# Patient Record
Sex: Female | Born: 1960 | Race: White | Hispanic: No | Marital: Married | Smoking: Never smoker
Health system: Southern US, Community
[De-identification: ages and names within clinical notes are randomized; demographics above are authoritative.]

## PROBLEM LIST (undated history)

## (undated) DIAGNOSIS — G4700 Insomnia, unspecified: Secondary | ICD-10-CM

## (undated) DIAGNOSIS — F32A Depression, unspecified: Secondary | ICD-10-CM

## (undated) DIAGNOSIS — IMO0002 Reserved for concepts with insufficient information to code with codable children: Secondary | ICD-10-CM

## (undated) DIAGNOSIS — R531 Weakness: Secondary | ICD-10-CM

## (undated) DIAGNOSIS — R0602 Shortness of breath: Secondary | ICD-10-CM

## (undated) DIAGNOSIS — R350 Frequency of micturition: Secondary | ICD-10-CM

## (undated) DIAGNOSIS — F418 Other specified anxiety disorders: Secondary | ICD-10-CM

## (undated) DIAGNOSIS — K219 Gastro-esophageal reflux disease without esophagitis: Secondary | ICD-10-CM

## (undated) DIAGNOSIS — F419 Anxiety disorder, unspecified: Secondary | ICD-10-CM

## (undated) DIAGNOSIS — Z8709 Personal history of other diseases of the respiratory system: Secondary | ICD-10-CM

## (undated) DIAGNOSIS — R42 Dizziness and giddiness: Secondary | ICD-10-CM

## (undated) DIAGNOSIS — K5900 Constipation, unspecified: Secondary | ICD-10-CM

## (undated) DIAGNOSIS — M549 Dorsalgia, unspecified: Secondary | ICD-10-CM

## (undated) DIAGNOSIS — F329 Major depressive disorder, single episode, unspecified: Secondary | ICD-10-CM

## (undated) DIAGNOSIS — M62838 Other muscle spasm: Secondary | ICD-10-CM

## (undated) DIAGNOSIS — R51 Headache: Secondary | ICD-10-CM

## (undated) DIAGNOSIS — R351 Nocturia: Secondary | ICD-10-CM

## (undated) DIAGNOSIS — E559 Vitamin D deficiency, unspecified: Secondary | ICD-10-CM

## (undated) DIAGNOSIS — M1990 Unspecified osteoarthritis, unspecified site: Secondary | ICD-10-CM

## (undated) HISTORY — DX: Reserved for concepts with insufficient information to code with codable children: IMO0002

## (undated) HISTORY — PX: SHOULDER ARTHROSCOPY WITH ROTATOR CUFF REPAIR: SHX5685

## (undated) HISTORY — DX: Unspecified osteoarthritis, unspecified site: M19.90

## (undated) HISTORY — PX: COLONOSCOPY: SHX174

## (undated) HISTORY — PX: ABDOMINAL HYSTERECTOMY: SHX81

## (undated) HISTORY — PX: OVARIAN CYST SURGERY: SHX726

## (undated) HISTORY — PX: EXPLORATORY LAPAROTOMY: SUR591

## (undated) HISTORY — PX: BACK SURGERY: SHX140

---

## 1978-11-11 HISTORY — PX: TUBAL LIGATION: SHX77

## 1997-10-06 ENCOUNTER — Emergency Department (HOSPITAL_COMMUNITY): Admission: EM | Admit: 1997-10-06 | Discharge: 1997-10-06 | Payer: Self-pay | Admitting: Emergency Medicine

## 1997-10-06 ENCOUNTER — Inpatient Hospital Stay (HOSPITAL_COMMUNITY): Admission: EM | Admit: 1997-10-06 | Discharge: 1997-10-09 | Payer: Self-pay | Admitting: Emergency Medicine

## 1999-05-25 ENCOUNTER — Encounter: Payer: Self-pay | Admitting: Obstetrics and Gynecology

## 1999-05-25 ENCOUNTER — Encounter: Admission: RE | Admit: 1999-05-25 | Discharge: 1999-05-25 | Payer: Self-pay | Admitting: Obstetrics and Gynecology

## 1999-11-25 ENCOUNTER — Emergency Department (HOSPITAL_COMMUNITY): Admission: EM | Admit: 1999-11-25 | Discharge: 1999-11-26 | Payer: Self-pay | Admitting: *Deleted

## 1999-11-26 ENCOUNTER — Encounter: Payer: Self-pay | Admitting: Emergency Medicine

## 2000-03-07 ENCOUNTER — Other Ambulatory Visit: Admission: RE | Admit: 2000-03-07 | Discharge: 2000-03-07 | Payer: Self-pay | Admitting: Orthopedic Surgery

## 2000-06-27 ENCOUNTER — Encounter: Admission: RE | Admit: 2000-06-27 | Discharge: 2000-06-27 | Payer: Self-pay | Admitting: Obstetrics and Gynecology

## 2000-06-27 ENCOUNTER — Encounter: Payer: Self-pay | Admitting: Obstetrics and Gynecology

## 2007-08-08 ENCOUNTER — Emergency Department (HOSPITAL_COMMUNITY): Admission: EM | Admit: 2007-08-08 | Discharge: 2007-08-08 | Payer: Self-pay | Admitting: Emergency Medicine

## 2007-08-17 ENCOUNTER — Emergency Department (HOSPITAL_COMMUNITY): Admission: EM | Admit: 2007-08-17 | Discharge: 2007-08-17 | Payer: Self-pay | Admitting: Family Medicine

## 2008-04-29 ENCOUNTER — Other Ambulatory Visit: Admission: RE | Admit: 2008-04-29 | Discharge: 2008-04-29 | Payer: Self-pay | Admitting: Obstetrics and Gynecology

## 2009-05-25 ENCOUNTER — Other Ambulatory Visit: Admission: RE | Admit: 2009-05-25 | Discharge: 2009-05-25 | Payer: Self-pay | Admitting: Obstetrics and Gynecology

## 2010-06-27 ENCOUNTER — Other Ambulatory Visit: Payer: Self-pay | Admitting: Family Medicine

## 2010-06-27 DIAGNOSIS — N61 Mastitis without abscess: Secondary | ICD-10-CM

## 2010-06-27 DIAGNOSIS — N6310 Unspecified lump in the right breast, unspecified quadrant: Secondary | ICD-10-CM

## 2010-06-27 DIAGNOSIS — N63 Unspecified lump in unspecified breast: Secondary | ICD-10-CM

## 2010-06-28 ENCOUNTER — Ambulatory Visit
Admission: RE | Admit: 2010-06-28 | Discharge: 2010-06-28 | Disposition: A | Discharge status: Home / Self Care | Payer: 59 | Source: Ambulatory Visit | Attending: Family Medicine | Admitting: Family Medicine

## 2010-06-28 ENCOUNTER — Ambulatory Visit
Admission: RE | Admit: 2010-06-28 | Discharge: 2010-06-28 | Disposition: A | Discharge status: Home / Self Care | Payer: Self-pay | Source: Ambulatory Visit | Attending: Family Medicine | Admitting: Family Medicine

## 2010-06-28 ENCOUNTER — Other Ambulatory Visit: Payer: Self-pay | Admitting: Family Medicine

## 2010-06-28 DIAGNOSIS — N63 Unspecified lump in unspecified breast: Secondary | ICD-10-CM

## 2010-06-28 DIAGNOSIS — N61 Mastitis without abscess: Secondary | ICD-10-CM

## 2010-08-16 ENCOUNTER — Encounter (INDEPENDENT_AMBULATORY_CARE_PROVIDER_SITE_OTHER): Payer: Self-pay | Admitting: Surgery

## 2010-12-06 LAB — DIFFERENTIAL
Basophils Relative: 0
Eosinophils Absolute: 0
Lymphs Abs: 1.1
Neutrophils Relative %: 67

## 2010-12-06 LAB — CBC
MCHC: 34.4
MCV: 90
Platelets: 217
WBC: 4.2

## 2010-12-06 LAB — POCT I-STAT, CHEM 8
HCT: 37
Hemoglobin: 12.6
Potassium: 4.4
Sodium: 139

## 2011-04-25 ENCOUNTER — Emergency Department (INDEPENDENT_AMBULATORY_CARE_PROVIDER_SITE_OTHER)
Admission: EM | Admit: 2011-04-25 | Discharge: 2011-04-25 | Disposition: A | Discharge status: Home / Self Care | Payer: Self-pay | Source: Home / Self Care | Attending: Family Medicine | Admitting: Family Medicine

## 2011-04-25 ENCOUNTER — Encounter (HOSPITAL_COMMUNITY): Payer: Self-pay | Admitting: *Deleted

## 2011-04-25 DIAGNOSIS — R059 Cough, unspecified: Secondary | ICD-10-CM

## 2011-04-25 DIAGNOSIS — R05 Cough: Secondary | ICD-10-CM

## 2011-04-25 DIAGNOSIS — R058 Other specified cough: Secondary | ICD-10-CM

## 2011-04-25 MED ORDER — HYDROCOD POLST-CHLORPHEN POLST 10-8 MG/5ML PO LQCR: 5.0000 mL | Freq: Two times a day (BID) | ORAL | Status: DC | PRN

## 2011-04-25 NOTE — ED Notes (Signed)
Pt with c/o cough/congestion/sorethroat onset x 10 days - dry cough

## 2011-04-25 NOTE — ED Provider Notes (Signed)
History     CSN: [id number]  Arrival date & time 04/25/11  1223   First MD Initiated Contact with Patient 04/25/11 1301      Chief Complaint  Patient presents with  . Cough  . Nasal Congestion  . Sore Throat    (Consider location/radiation/quality/duration/timing/severity/associated sxs/prior treatment) HPI Comments: [name] presents for evaluation for persistent nonproductive cough. She denies any other symptoms. No nasal congestion. No rhinorrhea. No fever. She reports chills last week. States that her father had the flu in December. She's been taking many over-the-counter products without relief.  Patient is a 51 y.o. female presenting with cough. The history is provided by the patient.  Cough This is a new problem. The current episode started more than 1 week ago. The problem occurs constantly. The problem has not changed since onset.The cough is non-productive. There has been no fever. Associated symptoms include sore throat. She has tried decongestants and cough syrup for the symptoms. The treatment provided no relief. She is not a smoker.    Past Medical History  Diagnosis Date  . Ulcer   . Hernia   . Arthritis     Past Surgical History  Procedure Date  . Abdominal hysterectomy   . Ovarian cyst surgery     Family History  Problem Relation Age of Onset  . COPD Father     History  Substance Use Topics  . Smoking status: Never Smoker   . Smokeless tobacco: Not on file  . Alcohol Use: Yes     RARE    OB History    Grav Para Term Preterm Abortions TAB SAB Ect Mult Living                  Review of Systems  Constitutional: Negative.   HENT: Positive for sore throat.   Eyes: Negative.   Respiratory: Positive for cough.   Cardiovascular: Negative.   Gastrointestinal: Negative.   Genitourinary: Negative.   Musculoskeletal: Negative.   Skin: Negative.   Neurological: Negative.     Allergies  Toradol  Home Medications   Current Outpatient Rx  Name  Route Sig Dispense Refill  . CORICIDIN D PO Oral Take by mouth.    . ESTRADIOL 0.05 MG/24HR TD PTTW Transdermal Place 1 patch onto the skin once a week.      . GUAIFENESIN ER 600 MG PO TB12 Oral Take 1,200 mg by mouth 2 (two) times daily.    Marland Kitchen HYDROCOD POLST-CPM POLST ER 10-8 MG/5ML PO LQCR Oral Take 5 mLs by mouth every 12 (twelve) hours as needed. 140 mL 0  . DOXYCYCLINE HYCLATE 100 MG PO CAPS Oral Take 100 mg by mouth 2 (two) times daily.        BP 136/79  Pulse 74  Temp(Src) 98.4 F (36.9 C) (Oral)  Resp 18  SpO2 99%  Physical Exam  Nursing note and vitals reviewed. Constitutional: She is oriented to person, place, and time. She appears well-developed and well-nourished.  HENT:  Head: Normocephalic and atraumatic.  Right Ear: Tympanic membrane normal.  Left Ear: Tympanic membrane normal.  Mouth/Throat: Uvula is midline, oropharynx is clear and moist and mucous membranes are normal.  Eyes: EOM are normal.  Neck: Normal range of motion.  Cardiovascular: Normal rate and regular rhythm.   Pulmonary/Chest: Effort normal and breath sounds normal. She has no wheezes. She has no rales.  Musculoskeletal: Normal range of motion.  Neurological: She is alert and oriented to person, place, and time.  Skin:  Skin is warm and dry.  Psychiatric: Her behavior is normal.    ED Course  Procedures (including critical care time)  Labs Reviewed - No data to display No results found.   1. Post-viral cough syndrome       MDM  rx given for Tussionex Pennkinetic syrup        Richardo Priest, MD 04/25/11 1409

## 2011-04-25 NOTE — Discharge Instructions (Signed)
Your exam today, was unremarkable. Your lungs were clear. This is likely a postviral inflammatory cough. I prescribed you a cough syrup containing a narcotic. Please return to care. Should her symptoms not improve or worsen in any way.

## 2011-06-06 ENCOUNTER — Emergency Department (HOSPITAL_COMMUNITY): Payer: PRIVATE HEALTH INSURANCE

## 2011-06-06 ENCOUNTER — Encounter (HOSPITAL_COMMUNITY): Payer: Self-pay | Admitting: Emergency Medicine

## 2011-06-06 ENCOUNTER — Emergency Department (HOSPITAL_COMMUNITY)
Admission: EM | Admit: 2011-06-06 | Discharge: 2011-06-06 | Disposition: A | Discharge status: Home / Self Care | Payer: PRIVATE HEALTH INSURANCE | Attending: Emergency Medicine | Admitting: Emergency Medicine

## 2011-06-06 DIAGNOSIS — S335XXA Sprain of ligaments of lumbar spine, initial encounter: Secondary | ICD-10-CM | POA: Insufficient documentation

## 2011-06-06 DIAGNOSIS — M545 Low back pain, unspecified: Secondary | ICD-10-CM | POA: Insufficient documentation

## 2011-06-06 DIAGNOSIS — S39012A Strain of muscle, fascia and tendon of lower back, initial encounter: Secondary | ICD-10-CM

## 2011-06-06 DIAGNOSIS — H9319 Tinnitus, unspecified ear: Secondary | ICD-10-CM | POA: Insufficient documentation

## 2011-06-06 DIAGNOSIS — H538 Other visual disturbances: Secondary | ICD-10-CM | POA: Insufficient documentation

## 2011-06-06 DIAGNOSIS — S161XXA Strain of muscle, fascia and tendon at neck level, initial encounter: Secondary | ICD-10-CM

## 2011-06-06 DIAGNOSIS — W19XXXA Unspecified fall, initial encounter: Secondary | ICD-10-CM

## 2011-06-06 DIAGNOSIS — S139XXA Sprain of joints and ligaments of unspecified parts of neck, initial encounter: Secondary | ICD-10-CM | POA: Insufficient documentation

## 2011-06-06 DIAGNOSIS — M542 Cervicalgia: Secondary | ICD-10-CM | POA: Insufficient documentation

## 2011-06-06 DIAGNOSIS — S0990XA Unspecified injury of head, initial encounter: Secondary | ICD-10-CM | POA: Insufficient documentation

## 2011-06-06 DIAGNOSIS — M129 Arthropathy, unspecified: Secondary | ICD-10-CM | POA: Insufficient documentation

## 2011-06-06 DIAGNOSIS — R51 Headache: Secondary | ICD-10-CM | POA: Insufficient documentation

## 2011-06-06 DIAGNOSIS — W108XXA Fall (on) (from) other stairs and steps, initial encounter: Secondary | ICD-10-CM | POA: Insufficient documentation

## 2011-06-06 DIAGNOSIS — IMO0002 Reserved for concepts with insufficient information to code with codable children: Secondary | ICD-10-CM | POA: Insufficient documentation

## 2011-06-06 MED ORDER — CYCLOBENZAPRINE HCL 10 MG PO TABS
10.0000 mg | ORAL_TABLET | Freq: Three times a day (TID) | ORAL | Status: AC | PRN
Start: 2011-06-06 — End: 2011-06-16

## 2011-06-06 MED ORDER — CYCLOBENZAPRINE HCL 10 MG PO TABS
10.0000 mg | ORAL_TABLET | Freq: Once | ORAL | Status: AC
  Administered 2011-06-06: 10 mg via ORAL
  Filled 2011-06-06: qty 1

## 2011-06-06 MED ORDER — OXYCODONE-ACETAMINOPHEN 5-325 MG PO TABS: 1.0000 | ORAL_TABLET | ORAL | Status: AC | PRN

## 2011-06-06 NOTE — ED Provider Notes (Signed)
History     CSN: [id number]  Arrival date & time 06/06/11  1606   First MD Initiated Contact with Patient 06/06/11 1705      Chief Complaint  Patient presents with  . Head Injury  . Back Pain    (Consider location/radiation/quality/duration/timing/severity/associated sxs/prior treatment) HPI Comments: Pt is a 51 year old woman who was going through a door and tripped on a step and fell.  This happened about 1:30 P.M.  She hit her head on some chair and landed on the left side on a hard carpetted floor. She was not rendered unconscious.  She scraped her knees, and put bandaids on the scraped areas.  She sat at her desk and developed headache and her computer screen became blurry.  She had ringing in her ears.  She felt pain in her head, neck, and lower back.  She went to [facility] and was referred to [facility] ED for further evaluation.  Patient is a 51 y.o. female presenting with head injury and back pain. The history is provided by the patient and medical records. No language interpreter was used.  Head Injury  The incident occurred 3 to 5 hours ago. She came to the ER via walk-in. The injury mechanism was a fall. There was no loss of consciousness. There was no blood loss. The quality of the pain is described as dull. The pain is at a severity of 10/10. The pain is severe. The pain has been constant since the injury. Associated symptoms include blurred vision, tinnitus and disorientation. Pertinent negatives include no vomiting, no weakness and no memory loss. Found by EMS: Came to ED via POV with family member. She has tried nothing for the symptoms.  Back Pain  Associated symptoms include headaches. Pertinent negatives include no weakness.    Past Medical History  Diagnosis Date  . Ulcer   . Hernia   . Arthritis     Past Surgical History  Procedure Date  . Abdominal hysterectomy   . Ovarian cyst surgery     Family History  Problem Relation Age of Onset  . COPD Father       History  Substance Use Topics  . Smoking status: Never Smoker   . Smokeless tobacco: Not on file  . Alcohol Use: No    OB History    Grav Para Term Preterm Abortions TAB SAB Ect Mult Living                  Review of Systems  Constitutional: Negative.   HENT: Positive for neck pain and tinnitus.   Eyes: Positive for blurred vision.  Respiratory: Negative.   Cardiovascular: Negative.   Gastrointestinal: Negative for vomiting.  Genitourinary: Negative.   Musculoskeletal: Positive for back pain.  Neurological: Positive for headaches. Negative for weakness.  Psychiatric/Behavioral: Negative.  Negative for memory loss.    Allergies  Toradol  Home Medications   Current Outpatient Rx  Name Route Sig Dispense Refill  . ALPRAZOLAM 2 MG PO TABS Oral Take 2 mg by mouth at bedtime as needed. For anxiety    . CORICIDIN D PO Oral Take by mouth.    Marland Kitchen HYDROCOD POLST-CPM POLST ER 10-8 MG/5ML PO LQCR Oral Take 5 mLs by mouth every 12 (twelve) hours as needed. 140 mL 0  . DOXYCYCLINE HYCLATE 100 MG PO CAPS Oral Take 100 mg by mouth 2 (two) times daily.      Marland Kitchen ESTRADIOL 0.05 MG/24HR TD PTTW Transdermal Place 1 patch  onto the skin once a week.      . GUAIFENESIN ER 600 MG PO TB12 Oral Take 1,200 mg by mouth 2 (two) times daily.    Marland Kitchen ZOLPIDEM TARTRATE 10 MG PO TABS Oral Take 10 mg by mouth at bedtime as needed. For insomnia      BP 139/80  Pulse 85  Temp(Src) 97.6 F (36.4 C) (Oral)  Resp 18  SpO2 96%  Physical Exam  Nursing note and vitals reviewed. Constitutional: She is oriented to person, place, and time.       Appears uncomfortable, has a cervical collar in place.  HENT:  Head: Normocephalic and atraumatic.  Right Ear: External ear normal.  Left Ear: External ear normal.  Mouth/Throat: Oropharynx is clear and moist.  Eyes: Conjunctivae and EOM are normal.  Neck: Normal range of motion. Neck supple.  Cardiovascular: Normal rate, regular rhythm and normal heart sounds.    Pulmonary/Chest: Effort normal and breath sounds normal.  Abdominal: Soft. Bowel sounds are normal.  Musculoskeletal:       Pain localized to lumbar region.  No palpable deformity or tenderness to palpation.   Neurological: She is alert and oriented to person, place, and time.       No sensory or motor deficit.  Skin: Skin is warm and dry.  Psychiatric: She has a normal mood and affect. Her behavior is normal.    ED Course  Procedures (including critical care time)  5:40 PM Pt seen -- had physical exam performed.  PO Flexeril ordered.  CT of head and neck, conventional x-rays of LS spine ordered.   7:09 PM Results for orders placed during the hospital encounter of 08/08/07  CBC      Component Value Range   WBC 4.2     RBC 4.15     Hemoglobin 12.9     HCT 37.4     MCV 90.0     MCHC 34.4     RDW 12.2     Platelets 217    DIFFERENTIAL      Component Value Range   Neutrophils Relative 67     Neutro Abs 2.8     Lymphocytes Relative 26     Lymphs Abs 1.1     Monocytes Relative 6     Monocytes Absolute 0.2     Eosinophils Relative 1     Eosinophils Absolute 0.0     Basophils Relative 0     Basophils Absolute 0.0    POCT I-STAT, CHEM 8      Component Value Range   Sodium 139     Potassium 4.4     Chloride 105     BUN 14     Creatinine, Ser 0.7     Glucose, Bld 84     Calcium, Ion 1.14     TCO2 25     Hemoglobin 12.6     HCT 37.0     Dg Lumbar Spine Complete  06/06/2011  *RADIOLOGY REPORT*  Clinical Data: Severe low back pain post fall at work  LUMBAR SPINE - COMPLETE 4+ VIEW  Comparison: None  Findings: Five non-rib bearing lumbar vertebrae. Minimal disc space narrowing endplate spur formation L2-L3. Vertebral body heights maintained. No acute fracture, subluxation or bone destruction. No spondylolysis. SI joints symmetric.  IMPRESSION: Mild degenerative disc disease changes L2-L3. No acute bony abnormalities.  Original Report Authenticated By: Lollie Marrow, M.D.    Ct Head Wo Contrast  06/06/2011  *RADIOLOGY REPORT*  Clinical Data:  Larey Seat.  Hit head.  CT HEAD WITHOUT CONTRAST CT CERVICAL SPINE WITHOUT CONTRAST  Technique:  Multidetector CT imaging of the head and cervical spine was performed following the standard protocol without intravenous contrast.  Multiplanar CT image reconstructions of the cervical spine were also generated.  Comparison:   None  CT HEAD  Findings: The ventricles are normal.  No extra-axial fluid collections are seen.  The brainstem and cerebellum are unremarkable.  No acute intracranial findings such as infarction or hemorrhage.  No mass lesions.  The bony calvarium is intact.  The visualized paranasal sinuses and mastoid air cells are clear.  IMPRESSION: No acute intracranial findings or skull fracture.  CT CERVICAL SPINE  Findings: The sagittal reformatted images demonstrate normal alignment of the cervical vertebral bodies.  Mild straightening of the normal cervical lordosis may be due to positioning, muscle spasm or pain.  Moderate degenerative disc disease noted at C5-6 and C6-7 with mild uncinate spurring changes and mild bony foraminal narrowing.  No acute bony findings or abnormal prevertebral soft tissue swelling.  The facets are normally aligned.  No facet or laminar fractures are seen. No large disc protrusions.  The neural foramen are patent.  The skull base C1 and C1-C2 articulations are maintained.  The dens is normal.  There are scattered cervical lymph nodes.  The lung apices are clear.  IMPRESSION:  1.  Degenerative cervical spondylosis with disc disease and facet disease most notable at C5-6 and C6-7. 2.  No acute cervical spine fracture.  Original Report Authenticated By: P. Loralie Champagne, M.D.   Ct Cervical Spine Wo Contrast  06/06/2011  *RADIOLOGY REPORT*  Clinical Data:  Larey Seat.  Hit head.  CT HEAD WITHOUT CONTRAST CT CERVICAL SPINE WITHOUT CONTRAST  Technique:  Multidetector CT imaging of the head and cervical spine was  performed following the standard protocol without intravenous contrast.  Multiplanar CT image reconstructions of the cervical spine were also generated.  Comparison:   None  CT HEAD  Findings: The ventricles are normal.  No extra-axial fluid collections are seen.  The brainstem and cerebellum are unremarkable.  No acute intracranial findings such as infarction or hemorrhage.  No mass lesions.  The bony calvarium is intact.  The visualized paranasal sinuses and mastoid air cells are clear.  IMPRESSION: No acute intracranial findings or skull fracture.  CT CERVICAL SPINE  Findings: The sagittal reformatted images demonstrate normal alignment of the cervical vertebral bodies.  Mild straightening of the normal cervical lordosis may be due to positioning, muscle spasm or pain.  Moderate degenerative disc disease noted at C5-6 and C6-7 with mild uncinate spurring changes and mild bony foraminal narrowing.  No acute bony findings or abnormal prevertebral soft tissue swelling.  The facets are normally aligned.  No facet or laminar fractures are seen. No large disc protrusions.  The neural foramen are patent.  The skull base C1 and C1-C2 articulations are maintained.  The dens is normal.  There are scattered cervical lymph nodes.  The lung apices are clear.  IMPRESSION:  1.  Degenerative cervical spondylosis with disc disease and facet disease most notable at C5-6 and C6-7. 2.  No acute cervical spine fracture.  Original Report Authenticated By: P. Loralie Champagne, M.D.    Rx with Flexeril for muscle spasm, Percocet for pain, No work for 3 days.   1. Fall   2. Cervical strain   3. Lumbosacral strain           Hessie Diener  Marcia Brash, MD 06/06/11 [phone]

## 2011-06-06 NOTE — ED Notes (Signed)
Patient given discharge instructions, information, prescriptions, and diet order. Patient states that they adequately understand discharge information given and to return to ED if symptoms return or worsen.     

## 2011-06-06 NOTE — ED Notes (Signed)
Pt presenting to ed with c/o falling into a door pt states she missed a step. Pt states she hit her head. Pt states onset 13:30. Pt states she took aleve with no relief pt states she does not remember if she had loc. Pt states she continued to sit at her desk and developed neck, back pain and ringing in her ears. Pt states she presented to [facility] and was told to present to ed. Pt states she had some confusion. Pt is  Alert and oriented. Pt denies nausea and vomiting but states when she closes her eyes she feels like she's moving.

## 2012-07-27 ENCOUNTER — Emergency Department (HOSPITAL_COMMUNITY)
Admission: EM | Admit: 2012-07-27 | Discharge: 2012-07-27 | Disposition: A | Discharge status: Home / Self Care | Payer: BC Managed Care – PPO | Attending: Emergency Medicine | Admitting: Emergency Medicine

## 2012-07-27 ENCOUNTER — Emergency Department (HOSPITAL_COMMUNITY): Payer: BC Managed Care – PPO

## 2012-07-27 ENCOUNTER — Encounter (HOSPITAL_COMMUNITY): Payer: Self-pay | Admitting: Emergency Medicine

## 2012-07-27 DIAGNOSIS — Z8719 Personal history of other diseases of the digestive system: Secondary | ICD-10-CM | POA: Insufficient documentation

## 2012-07-27 DIAGNOSIS — W19XXXA Unspecified fall, initial encounter: Secondary | ICD-10-CM | POA: Insufficient documentation

## 2012-07-27 DIAGNOSIS — S0093XA Contusion of unspecified part of head, initial encounter: Secondary | ICD-10-CM

## 2012-07-27 DIAGNOSIS — W102XXA Fall (on)(from) incline, initial encounter: Secondary | ICD-10-CM

## 2012-07-27 DIAGNOSIS — M1990 Unspecified osteoarthritis, unspecified site: Secondary | ICD-10-CM | POA: Insufficient documentation

## 2012-07-27 DIAGNOSIS — Y929 Unspecified place or not applicable: Secondary | ICD-10-CM | POA: Insufficient documentation

## 2012-07-27 DIAGNOSIS — Y9389 Activity, other specified: Secondary | ICD-10-CM | POA: Insufficient documentation

## 2012-07-27 DIAGNOSIS — Z79899 Other long term (current) drug therapy: Secondary | ICD-10-CM | POA: Insufficient documentation

## 2012-07-27 DIAGNOSIS — S0003XA Contusion of scalp, initial encounter: Secondary | ICD-10-CM | POA: Insufficient documentation

## 2012-07-27 MED ORDER — CYCLOBENZAPRINE HCL 10 MG PO TABS: 10.0000 mg | ORAL_TABLET | Freq: Two times a day (BID) | ORAL | Status: DC | PRN

## 2012-07-27 MED ORDER — ONDANSETRON 4 MG PO TBDP
4.0000 mg | ORAL_TABLET | Freq: Once | ORAL | Status: AC
  Administered 2012-07-27: 4 mg via ORAL
  Filled 2012-07-27: qty 1

## 2012-07-27 MED ORDER — HYDROCODONE-ACETAMINOPHEN 5-325 MG PO TABS: 1.0000 | ORAL_TABLET | ORAL | Status: DC | PRN

## 2012-07-27 MED ORDER — OXYCODONE-ACETAMINOPHEN 5-325 MG PO TABS
2.0000 | ORAL_TABLET | Freq: Once | ORAL | Status: AC
  Administered 2012-07-27: 2 via ORAL
  Filled 2012-07-27: qty 2

## 2012-07-27 NOTE — ED Notes (Signed)
Patient returned to room from CT. 

## 2012-07-27 NOTE — ED Provider Notes (Signed)
Medical screening examination/treatment/procedure(s) were performed by non-physician practitioner and as supervising physician I was immediately available for consultation/collaboration.   Glynn Octave, MD 07/27/12 [phone]

## 2012-07-27 NOTE — ED Notes (Signed)
Bed:WA25<BR> Expected date:<BR> Expected time:<BR> Means of arrival:<BR> Comments:<BR> triage

## 2012-07-27 NOTE — ED Notes (Signed)
Per patient, fell backwards while pushing grandson on swing-hit head, and doesn't remember, possible LOC for a few seconds-dazed, vision blurred in both eyes, feels very tired

## 2012-07-27 NOTE — ED Notes (Signed)
Patient transported to CT 

## 2012-07-27 NOTE — ED Provider Notes (Signed)
History     CSN: [id number]  Arrival date & time 07/27/12  1553   First MD Initiated Contact with Patient 07/27/12 1639      Chief Complaint  Patient presents with  . Fall    (Consider location/radiation/quality/duration/timing/severity/associated sxs/prior treatment) HPI  Patient presents ot the ED for evaluation after a fall that happened at 1200 today in [place]. She was sitting on a swing with her grandson in her lap when she attempted to go higher and fell off the swing backwards onto her headache neck. She thinks she had loc because she only remembers her 79 year old grandson standing over her. She was able to get up afterwards without nausea or vomiting. She had some blurry vision. Went to see her friend who is an Event organiser that felt she had a concussion. Patient rode in car all the way home but since she was still having neck and head pain decided to come get evaluated. Denies having continued blurred vision, weakness (generalized or focal). Her main complaint is pain and feeling sleepy.   Past Medical History  Diagnosis Date  . Ulcer   . Hernia   . Arthritis     Past Surgical History  Procedure Laterality Date  . Abdominal hysterectomy    . Ovarian cyst surgery      Family History  Problem Relation Age of Onset  . COPD Father     History  Substance Use Topics  . Smoking status: Never Smoker   . Smokeless tobacco: Not on file  . Alcohol Use: No    OB History   Grav Para Term Preterm Abortions TAB SAB Ect Mult Living                  Review of Systems  All other systems reviewed and are negative.    Allergies  Ketorolac tromethamine  Home Medications   Current Outpatient Rx  Name  Route  Sig  Dispense  Refill  . acetaminophen (TYLENOL) 500 MG tablet   Oral   Take 1,000 mg by mouth every 6 (six) hours as needed for pain.         Marland Kitchen alprazolam (XANAX) 2 MG tablet   Oral   Take 2 mg by mouth at bedtime as needed. For anxiety          . CALCIUM CARBONATE PO   Oral   Take 2 tablets by mouth daily. Calcium gummies         . escitalopram (LEXAPRO) 5 MG tablet   Oral   Take 5 mg by mouth daily.         Marland Kitchen estradiol (ESTRACE) 0.5 MG tablet   Oral   Take 0.5 mg by mouth daily.         . Vitamin D, Ergocalciferol, (DRISDOL) 50000 UNITS CAPS   Oral   Take 50,000 Units by mouth 2 (two) times a week. mon and thur         . zolpidem (AMBIEN) 10 MG tablet   Oral   Take 10 mg by mouth at bedtime as needed. For insomnia         . cyclobenzaprine (FLEXERIL) 10 MG tablet   Oral   Take 1 tablet (10 mg total) by mouth 2 (two) times daily as needed for muscle spasms.   20 tablet   0   . HYDROcodone-acetaminophen (NORCO/VICODIN) 5-325 MG per tablet   Oral   Take 1 tablet by mouth every 4 (four) hours as  needed for pain.   12 tablet   0     BP 140/66  Pulse 71  Temp(Src) 98.1 F (36.7 C) (Oral)  Resp 18  SpO2 99%  Physical Exam  Nursing note and vitals reviewed. Constitutional: She is oriented to person, place, and time. She appears well-developed and well-nourished. No distress.  HENT:  Head: Normocephalic. Head is with contusion. Head is without raccoon's eyes, without Battle's sign, without abrasion, without laceration, without right periorbital erythema and without left periorbital erythema.  Eyes: Pupils are equal, round, and reactive to light.  Neck: Normal range of motion. Neck supple. Spinous process tenderness and muscular tenderness present. Normal range of motion present.  Cardiovascular: Normal rate and regular rhythm.   Pulmonary/Chest: Effort normal.  Abdominal: Soft.  Neurological: She is alert and oriented to person, place, and time. She has normal strength. No cranial nerve deficit or sensory deficit.  Skin: Skin is warm and dry.    ED Course  Procedures (including critical care time)  Labs Reviewed - No data to display Ct Head Wo Contrast  07/27/2012   *RADIOLOGY REPORT*   Clinical Data:  The patient fell backwards hitting head. Transient amnesia.  Possible loss of consciousness.  CT HEAD WITHOUT CONTRAST CT CERVICAL SPINE WITHOUT CONTRAST  Technique:  Multidetector CT imaging of the head and cervical spine was performed following the standard protocol without intravenous contrast.  Multiplanar CT image reconstructions of the cervical spine were also generated.  Comparison:  06/06/2011  CT HEAD  Findings: There is normal vertebral body stature and alignment.  No parenchymal masses or mass effect.  There are no areas of abnormal parenchymal attenuation.  There is no evidence of an infarct.  No extra-axial masses or abnormal fluid collections.  There is no intracranial hemorrhage.  The visualized sinuses and mastoid air cells are clear.  No skull fracture.  IMPRESSION: Normal unenhanced CT scan of the brain.  CT CERVICAL SPINE  Findings: No fracture or spondylolisthesis.  There is a mild reversal of the normal cervical lordosis centered at C5-C6.  There is moderate loss of disc height with endplate osteophytes and sclerosis at C5-C6 and C6-C7.  This leads to minor neural foraminal narrowing on the right at both levels and on the left at C6-C7.  No disc herniations.  No significant central stenosis.  The soft tissues are unremarkable.  The lung apices are clear.  IMPRESSION: No fracture or acute finding.   Original Report Authenticated By: Amie Portland, M.D.   Ct Cervical Spine Wo Contrast  07/27/2012   *RADIOLOGY REPORT*  Clinical Data:  The patient fell backwards hitting head. Transient amnesia.  Possible loss of consciousness.  CT HEAD WITHOUT CONTRAST CT CERVICAL SPINE WITHOUT CONTRAST  Technique:  Multidetector CT imaging of the head and cervical spine was performed following the standard protocol without intravenous contrast.  Multiplanar CT image reconstructions of the cervical spine were also generated.  Comparison:  06/06/2011  CT HEAD  Findings: There is normal vertebral  body stature and alignment.  No parenchymal masses or mass effect.  There are no areas of abnormal parenchymal attenuation.  There is no evidence of an infarct.  No extra-axial masses or abnormal fluid collections.  There is no intracranial hemorrhage.  The visualized sinuses and mastoid air cells are clear.  No skull fracture.  IMPRESSION: Normal unenhanced CT scan of the brain.  CT CERVICAL SPINE  Findings: No fracture or spondylolisthesis.  There is a mild reversal of the normal cervical  lordosis centered at C5-C6.  There is moderate loss of disc height with endplate osteophytes and sclerosis at C5-C6 and C6-C7.  This leads to minor neural foraminal narrowing on the right at both levels and on the left at C6-C7.  No disc herniations.  No significant central stenosis.  The soft tissues are unremarkable.  The lung apices are clear.  IMPRESSION: No fracture or acute finding.   Original Report Authenticated By: Amie Portland, M.D.     1. Fall (on)(from) incline, initial encounter   2. Head contusion, initial encounter       MDM   .normal ct scans of head and neck. No deficits on  Neuro exam.   Long discussion about concussion precautions. No driving. 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 Follow-up with neuro if symptoms develop or return to ER. Her and her husband are comfortable with going home. Vitals stable, not difficulty ambulating.  Pt has been advised of the symptoms that warrant their return to the ED. Patient has voiced understanding and has agreed to follow-up with the PCP or specialist.       Dorthula Matas, PA-C 07/27/12 1812

## 2012-10-12 ENCOUNTER — Emergency Department (HOSPITAL_COMMUNITY)
Admission: EM | Admit: 2012-10-12 | Discharge: 2012-10-12 | Disposition: A | Discharge status: Home / Self Care | Payer: BC Managed Care – PPO | Attending: Emergency Medicine | Admitting: Emergency Medicine

## 2012-10-12 ENCOUNTER — Emergency Department (HOSPITAL_COMMUNITY): Payer: BC Managed Care – PPO

## 2012-10-12 ENCOUNTER — Encounter (HOSPITAL_COMMUNITY): Payer: Self-pay | Admitting: *Deleted

## 2012-10-12 DIAGNOSIS — R51 Headache: Secondary | ICD-10-CM | POA: Insufficient documentation

## 2012-10-12 DIAGNOSIS — Z8739 Personal history of other diseases of the musculoskeletal system and connective tissue: Secondary | ICD-10-CM | POA: Insufficient documentation

## 2012-10-12 DIAGNOSIS — Z79899 Other long term (current) drug therapy: Secondary | ICD-10-CM | POA: Insufficient documentation

## 2012-10-12 DIAGNOSIS — R42 Dizziness and giddiness: Secondary | ICD-10-CM | POA: Insufficient documentation

## 2012-10-12 DIAGNOSIS — L98499 Non-pressure chronic ulcer of skin of other sites with unspecified severity: Secondary | ICD-10-CM | POA: Insufficient documentation

## 2012-10-12 DIAGNOSIS — Z8719 Personal history of other diseases of the digestive system: Secondary | ICD-10-CM | POA: Insufficient documentation

## 2012-10-12 DIAGNOSIS — R3915 Urgency of urination: Secondary | ICD-10-CM | POA: Insufficient documentation

## 2012-10-12 LAB — BASIC METABOLIC PANEL
BUN: 10 mg/dL (ref 6–23)
Calcium: 9.3 mg/dL (ref 8.4–10.5)
Creatinine, Ser: 0.63 mg/dL (ref 0.50–1.10)
GFR calc non Af Amer: >90 mL/min (ref >90)
Glucose, Bld: 94 mg/dL (ref 70–99)
Potassium: 4 mEq/L (ref 3.5–5.1)

## 2012-10-12 LAB — CBC WITH DIFFERENTIAL/PLATELET
Basophils Relative: 0 % (ref 0–1)
Eosinophils Absolute: 0.1 10*3/uL (ref 0.0–0.7)
Eosinophils Relative: 1 % (ref 0–5)
HCT: 37 % (ref 36.0–46.0)
Hemoglobin: 12.6 g/dL (ref 12.0–15.0)
Lymphs Abs: 1.3 10*3/uL (ref 0.7–4.0)
MCH: 30.7 pg (ref 26.0–34.0)
MCHC: 34.1 g/dL (ref 30.0–36.0)
MCV: 90.2 fL (ref 78.0–100.0)
Monocytes Absolute: 0.4 10*3/uL (ref 0.1–1.0)
Monocytes Relative: 7 % (ref 3–12)
RBC: 4.1 MIL/uL (ref 3.87–5.11)

## 2012-10-12 LAB — URINALYSIS, ROUTINE W REFLEX MICROSCOPIC
Bilirubin Urine: NEGATIVE
Hgb urine dipstick: NEGATIVE
Ketones, ur: NEGATIVE mg/dL
Specific Gravity, Urine: 1.012 (ref 1.005–1.030)
Urobilinogen, UA: 0.2 mg/dL (ref 0.0–1.0)

## 2012-10-12 MED ORDER — ONDANSETRON HCL 4 MG PO TABS: 4.0000 mg | ORAL_TABLET | Freq: Three times a day (TID) | ORAL | Status: DC | PRN

## 2012-10-12 NOTE — ED Notes (Signed)
Pt states since Friday has been feeling dizzy, states went to PCP yesterday and was told it was the [place] she took Friday night, was told if the dizziness and lightheadedness didn't go away to come to the ER for a CT scan of the head, pt states on way to hospital she felt like she was going to pass out. Pt states has also been nauseous and taking zofran for that.

## 2012-10-12 NOTE — ED Provider Notes (Signed)
CSN: [id number]     Arrival date & time 10/12/12  1140 History     First MD Initiated Contact with Patient 10/12/12 1203     Chief Complaint  Patient presents with  . Dizziness   (Consider location/radiation/quality/duration/timing/severity/associated sxs/prior Treatment) HPI Comments: Patient is a 52 year old female who presents today with a sensation of being lightheaded since yesterday morning. She took an Ambien Friday night and woke up Saturday morning feeling nauseous and lightheaded. It is worse with movement. She took Zofran yesterday which helped with the nausea. She was evaluated at [facility] physicians who stated it was a reaction to the Ambien, but if she did not improve to the emergency department for a head CT. She has urinary urgency without dysuria. She denies chest pain, shortness of breath, diaphoresis, weakness, numbness, paresthesias.  The history is provided by the patient. No language interpreter was used.    Past Medical History  Diagnosis Date  . Ulcer   . Hernia   . Arthritis    Past Surgical History  Procedure Laterality Date  . Abdominal hysterectomy    . Ovarian cyst surgery     Family History  Problem Relation Age of Onset  . COPD Father    History  Substance Use Topics  . Smoking status: Never Smoker   . Smokeless tobacco: Never Used  . Alcohol Use: No   OB History   Grav Para Term Preterm Abortions TAB SAB Ect Mult Living                 Review of Systems  Constitutional: Negative for fever and chills.  Respiratory: Negative for shortness of breath.   Gastrointestinal: Negative for vomiting and abdominal pain.  Genitourinary: Positive for urgency.  Neurological: Positive for light-headedness and headaches. Negative for speech difficulty.  All other systems reviewed and are negative.    Allergies  Ketorolac tromethamine  Home Medications   Current Outpatient Rx  Name  Route  Sig  Dispense  Refill  . acetaminophen (TYLENOL) 500 MG  tablet   Oral   Take 1,000 mg by mouth every 6 (six) hours as needed for pain.         Marland Kitchen alprazolam (XANAX) 2 MG tablet   Oral   Take 2 mg by mouth at bedtime as needed. For anxiety         . CALCIUM CARBONATE PO   Oral   Take 2 tablets by mouth daily. Calcium gummies         . cyclobenzaprine (FLEXERIL) 10 MG tablet   Oral   Take 1 tablet (10 mg total) by mouth 2 (two) times daily as needed for muscle spasms.   20 tablet   0   . escitalopram (LEXAPRO) 5 MG tablet   Oral   Take 5 mg by mouth daily.         Marland Kitchen estradiol (ESTRACE) 0.5 MG tablet   Oral   Take 0.5 mg by mouth daily.         Marland Kitchen HYDROcodone-acetaminophen (NORCO/VICODIN) 5-325 MG per tablet   Oral   Take 1 tablet by mouth every 4 (four) hours as needed for pain.   12 tablet   0   . Vitamin D, Ergocalciferol, (DRISDOL) 50000 UNITS CAPS   Oral   Take 50,000 Units by mouth 2 (two) times a week. mon and thur         . zolpidem (AMBIEN) 10 MG tablet   Oral   Take 10 mg  by mouth at bedtime as needed. For insomnia          BP 117/61  Pulse 64  Temp(Src) 98 F (36.7 C) (Oral)  Resp 20  SpO2 96% Physical Exam  Nursing note and vitals reviewed. Constitutional: She is oriented to person, place, and time. She appears well-developed and well-nourished. No distress.  Patient laying comfortably in the bed; no distress  HENT:  Head: Normocephalic and atraumatic.  Right Ear: External ear normal.  Left Ear: External ear normal.  Nose: Nose normal.  Mouth/Throat: Oropharynx is clear and moist.  Eyes: Conjunctivae are normal.  Neck: Normal range of motion.  Cardiovascular: Normal rate, regular rhythm, normal heart sounds, intact distal pulses and normal pulses.   Pulmonary/Chest: Effort normal and breath sounds normal. No stridor. No respiratory distress. She has no wheezes. She has no rales.  Abdominal: Soft. She exhibits no distension. There is no tenderness. There is no rigidity, no rebound and no  guarding.  Musculoskeletal: Normal range of motion.  Neurological: She is alert and oriented to person, place, and time. She has normal strength. No sensory deficit. She exhibits normal muscle tone. Coordination and gait normal.  Skin: Skin is warm and dry. She is not diaphoretic. No erythema.  Psychiatric: She has a normal mood and affect. Her behavior is normal.    ED Course   Procedures (including critical care time)  Labs Reviewed  CBC WITH DIFFERENTIAL  BASIC METABOLIC PANEL  URINALYSIS, ROUTINE W REFLEX MICROSCOPIC    Date: 10/12/2012  Rate: 57  Rhythm: normal sinus rhythm  QRS Axis: normal  Intervals: normal  ST/T Wave abnormalities: normal  Conduction Disutrbances:none  Narrative Interpretation:   Old EKG Reviewed: unchanged    Ct Head Wo Contrast  10/12/2012   *RADIOLOGY REPORT*  Clinical Data: Severe headache.  Nausea.  3-day history of dizziness.  CT HEAD WITHOUT CONTRAST  Technique:  Contiguous axial images were obtained from the base of the skull through the vertex without contrast.  Comparison: CT head 07/27/2012, 06/06/2011.  Findings: Ventricular system normal in size and appearance for age. No mass lesion.  No midline shift.  No acute hemorrhage or hematoma.  No extra-axial fluid collections.  No evidence of acute infarction.  No focal brain parenchymal abnormalities.  No significant interval change.  No focal osseous abnormalities involving the skull.  Visualized paranasal sinuses, mastoid air cells, and middle ear cavities well- aerated.  IMPRESSION: Normal and stable examination.   Original Report Authenticated By: Hulan Saas, M.D.   1. Lightheadedness     MDM  Patient presents with lightheadedness. CT head normal and stable. CBC, BMP, UA all WNL. Patient is not orthostatic. EKG WNL. Follow up with PCP tomorrow. Return instructions given. Vital signs stable for discharge. Discussed case with Dr. Judd Lien who agrees with plan. Patient / Family / Caregiver  informed of clinical course, understand medical decision-making process, and agree with plan.    Mora Bellman, PA-C 10/12/12 1524

## 2012-10-13 NOTE — ED Provider Notes (Signed)
Medical screening examination/treatment/procedure(s) were performed by non-physician practitioner and as supervising physician I was immediately available for consultation/collaboration.  Levette Paulick, MD 10/13/12 1119 

## 2013-02-12 ENCOUNTER — Ambulatory Visit (INDEPENDENT_AMBULATORY_CARE_PROVIDER_SITE_OTHER): Payer: BC Managed Care – PPO | Admitting: Podiatry

## 2013-02-12 ENCOUNTER — Encounter: Payer: Self-pay | Admitting: Podiatry

## 2013-02-12 ENCOUNTER — Ambulatory Visit (INDEPENDENT_AMBULATORY_CARE_PROVIDER_SITE_OTHER): Payer: BC Managed Care – PPO

## 2013-02-12 ENCOUNTER — Telehealth: Payer: Self-pay | Admitting: *Deleted

## 2013-02-12 VITALS — BP 112/78 | HR 86 | Resp 16 | Ht 65.0 in | Wt 160.0 lb

## 2013-02-12 DIAGNOSIS — M722 Plantar fascial fibromatosis: Secondary | ICD-10-CM

## 2013-02-12 MED ORDER — MELOXICAM 15 MG PO TABS: 15.0000 mg | ORAL_TABLET | Freq: Every day | ORAL | Status: DC

## 2013-02-12 MED ORDER — METHYLPREDNISOLONE (PAK) 4 MG PO TABS: ORAL_TABLET | ORAL | Status: DC

## 2013-02-12 NOTE — Patient Instructions (Signed)
Plantar Fasciitis (Heel Spur Syndrome)  with Rehab  The plantar fascia is a fibrous, ligament-like, soft-tissue structure that spans the bottom of the foot. Plantar fasciitis is a condition that causes pain in the foot due to inflammation of the tissue.  SYMPTOMS   · Pain and tenderness on the underneath side of the foot.  · Pain that worsens with standing or walking.  CAUSES   Plantar fasciitis is caused by irritation and injury to the plantar fascia on the underneath side of the foot. Common mechanisms of injury include:  · Direct trauma to bottom of the foot.  · Damage to a small nerve that runs under the foot where the main fascia attaches to the heel bone.  · Stress placed on the plantar fascia due to bone spurs.  RISK INCREASES WITH:   · Activities that place stress on the plantar fascia (running, jumping, pivoting, or cutting).  · Poor strength and flexibility.  · Improperly fitted shoes.  · Tight calf muscles.  · Flat feet.  · Failure to warm-up properly before activity.  · Obesity.  PREVENTION  · Warm up and stretch properly before activity.  · Allow for adequate recovery between workouts.  · Maintain physical fitness:  · Strength, flexibility, and endurance.  · Cardiovascular fitness.  · Maintain a health body weight.  · Avoid stress on the plantar fascia.  · Wear properly fitted shoes, including arch supports for individuals who have flat feet.  PROGNOSIS   If treated properly, then the symptoms of plantar fasciitis usually resolve without surgery. However, occasionally surgery is necessary.  RELATED COMPLICATIONS   · Recurrent symptoms that may result in a chronic condition.  · Problems of the lower back that are caused by compensating for the injury, such as limping.  · Pain or weakness of the foot during push-off following surgery.  · Chronic inflammation, scarring, and partial or complete fascia tear, occurring more often from repeated injections.  TREATMENT   Treatment initially involves the use of  ice and medication to help reduce pain and inflammation. The use of strengthening and stretching exercises may help reduce pain with activity, especially stretches of the Achilles tendon. These exercises may be performed at home or with a therapist. Your caregiver may recommend that you use heel cups of arch supports to help reduce stress on the plantar fascia. Occasionally, corticosteroid injections are given to reduce inflammation. If symptoms persist for greater than 6 months despite non-surgical (conservative), then surgery may be recommended.   MEDICATION   · If pain medication is necessary, then nonsteroidal anti-inflammatory medications, such as aspirin and ibuprofen, or other minor pain relievers, such as acetaminophen, are often recommended.  · Do not take pain medication within 7 days before surgery.  · Prescription pain relievers may be given if deemed necessary by your caregiver. Use only as directed and only as much as you need.  · Corticosteroid injections may be given by your caregiver. These injections should be reserved for the most serious cases, because they may only be given a certain number of times.  HEAT AND COLD  · Cold treatment (icing) relieves pain and reduces inflammation. Cold treatment should be applied for 10 to 15 minutes every 2 to 3 hours for inflammation and pain and immediately after any activity that aggravates your symptoms. Use ice packs or massage the area with a piece of ice (ice massage).  · Heat treatment may be used prior to performing the stretching and strengthening activities prescribed   by your caregiver, physical therapist, or athletic trainer. Use a heat pack or soak the injury in warm water.  SEEK IMMEDIATE MEDICAL CARE IF:  · Treatment seems to offer no benefit, or the condition worsens.  · Any medications produce adverse side effects.  EXERCISES  RANGE OF MOTION (ROM) AND STRETCHING EXERCISES - Plantar Fasciitis (Heel Spur Syndrome)  These exercises may help you  when beginning to rehabilitate your injury. Your symptoms may resolve with or without further involvement from your physician, physical therapist or athletic trainer. While completing these exercises, remember:   · Restoring tissue flexibility helps normal motion to return to the joints. This allows healthier, less painful movement and activity.  · An effective stretch should be held for at least 30 seconds.  · A stretch should never be painful. You should only feel a gentle lengthening or release in the stretched tissue.  RANGE OF MOTION - Toe Extension, Flexion  · Sit with your right / left leg crossed over your opposite knee.  · Grasp your toes and gently pull them back toward the top of your foot. You should feel a stretch on the bottom of your toes and/or foot.  · Hold this stretch for __________ seconds.  · Now, gently pull your toes toward the bottom of your foot. You should feel a stretch on the top of your toes and or foot.  · Hold this stretch for __________ seconds.  Repeat __________ times. Complete this stretch __________ times per day.   RANGE OF MOTION - Ankle Dorsiflexion, Active Assisted  · Remove shoes and sit on a chair that is preferably not on a carpeted surface.  · Place right / left foot under knee. Extend your opposite leg for support.  · Keeping your heel down, slide your right / left foot back toward the chair until you feel a stretch at your ankle or calf. If you do not feel a stretch, slide your bottom forward to the edge of the chair, while still keeping your heel down.  · Hold this stretch for __________ seconds.  Repeat __________ times. Complete this stretch __________ times per day.   STRETCH  Gastroc, Standing  · Place hands on wall.  · Extend right / left leg, keeping the front knee somewhat bent.  · Slightly point your toes inward on your back foot.  · Keeping your right / left heel on the floor and your knee straight, shift your weight toward the wall, not allowing your back to  arch.  · You should feel a gentle stretch in the right / left calf. Hold this position for __________ seconds.  Repeat __________ times. Complete this stretch __________ times per day.  STRETCH  Soleus, Standing  · Place hands on wall.  · Extend right / left leg, keeping the other knee somewhat bent.  · Slightly point your toes inward on your back foot.  · Keep your right / left heel on the floor, bend your back knee, and slightly shift your weight over the back leg so that you feel a gentle stretch deep in your back calf.  · Hold this position for __________ seconds.  Repeat __________ times. Complete this stretch __________ times per day.  STRETCH  Gastrocsoleus, Standing   Note: This exercise can place a lot of stress on your foot and ankle. Please complete this exercise only if specifically instructed by your caregiver.   · Place the ball of your right / left foot on a step, keeping   your other foot firmly on the same step.  · Hold on to the wall or a rail for balance.  · Slowly lift your other foot, allowing your body weight to press your heel down over the edge of the step.  · You should feel a stretch in your right / left calf.  · Hold this position for __________ seconds.  · Repeat this exercise with a slight bend in your right / left knee.  Repeat __________ times. Complete this stretch __________ times per day.   STRENGTHENING EXERCISES - Plantar Fasciitis (Heel Spur Syndrome)   These exercises may help you when beginning to rehabilitate your injury. They may resolve your symptoms with or without further involvement from your physician, physical therapist or athletic trainer. While completing these exercises, remember:   · Muscles can gain both the endurance and the strength needed for everyday activities through controlled exercises.  · Complete these exercises as instructed by your physician, physical therapist or athletic trainer. Progress the resistance and repetitions only as guided.  STRENGTH - Towel  Curls  · Sit in a chair positioned on a non-carpeted surface.  · Place your foot on a towel, keeping your heel on the floor.  · Pull the towel toward your heel by only curling your toes. Keep your heel on the floor.  · If instructed by your physician, physical therapist or athletic trainer, add ____________________ at the end of the towel.  Repeat __________ times. Complete this exercise __________ times per day.  STRENGTH - Ankle Inversion  · Secure one end of a rubber exercise band/tubing to a fixed object (table, pole). Loop the other end around your foot just before your toes.  · Place your fists between your knees. This will focus your strengthening at your ankle.  · Slowly, pull your big toe up and in, making sure the band/tubing is positioned to resist the entire motion.  · Hold this position for __________ seconds.  · Have your muscles resist the band/tubing as it slowly pulls your foot back to the starting position.  Repeat __________ times. Complete this exercises __________ times per day.   Document Released: 02/26/2005 Document Revised: 05/21/2011 Document Reviewed: 06/10/2008  [employer]® Patient Information ©2014 [employer].

## 2013-02-12 NOTE — Telephone Encounter (Signed)
Pt states would like Percocet , Vicodin does not help.  Dr Al Corpus states he does not give narcotics for plantar fasciitis.  I Informed of the orders and instructed her in the use of ice, Tylenol if she tolerated it and take all the steroid pack then Mobic , but not together. Pt states took one Mobic today.  I told her to begin the steroid pack tomorrow.  Pt states understanding.

## 2013-02-12 NOTE — Progress Notes (Signed)
   Subjective:    Patient ID: [name], female    DOB: [birth year], 52 y.o.   MRN: [id number]  HPI Comments: "My left heel hurts"  Patient c/o plantar heel left for approx 1 year. She has pain AM and throughout day. Tried Aleve, Naprosyn, insoles. The pain has been so bad that she has been nauseated. Aggravated by walking and standing.     Review of Systems  Constitutional: Positive for fatigue.  Eyes: Positive for itching.  Gastrointestinal: Positive for constipation and abdominal distention.  Musculoskeletal: Positive for arthralgias, back pain, gait problem and myalgias.  Neurological: Positive for headaches.  Hematological: Bruises/bleeds easily.       Objective:   Physical Exam: I have reviewed her past medical history medications and allergies. Vital signs are stable she is alert and oriented x3. Pulses are strongly palpable bilateral lower extremity. Capillary fill time to digits one through 5 of the bilateral foot is noted to be immediate. There is no calf pain. Deep tendon reflexes are intact bilateral. Muscle strength +5 over 5 dorsiflexors plantar flexors inverters and evertors are intact. All intrinsic musculature intact. Neurologic sensorium is intact per Semmes-Weinstein monofilament. Orthopedic evaluation demonstrates all joints distal to the ankle have a full range of motion without crepitation. She has no pain on medial lateral compression of the calcaneus. She does have pain on direct palpation of the medial calcaneal tubercle left foot. Radiographic evaluation does demonstrate soft tissue increase in density at the plantar fascial calcaneal insertion site.        Assessment & Plan:  Assessment: Plantar fasciitis left.  Plan: We discussed the etiology pathology conservative versus surgical therapies. I injected the left heel with Kenalog and local anesthetic. Put her in a plantar fascial strapping. Provided prescription for Medrol Dosepak to be followed by Mobic.  We discussed the appropriate shoe gear stretching exercises and ice therapy. I provided with her with stretching exercises and the definition of plantar fasciitis. We dispensed a night splint and I will followup with her in one month.

## 2013-03-17 ENCOUNTER — Ambulatory Visit: Payer: BC Managed Care – PPO | Admitting: Podiatry

## 2013-03-19 ENCOUNTER — Ambulatory Visit (INDEPENDENT_AMBULATORY_CARE_PROVIDER_SITE_OTHER): Payer: BC Managed Care – PPO | Admitting: Podiatry

## 2013-03-19 ENCOUNTER — Encounter: Payer: Self-pay | Admitting: Podiatry

## 2013-03-19 VITALS — BP 125/85 | HR 96 | Resp 12

## 2013-03-19 DIAGNOSIS — M722 Plantar fascial fibromatosis: Secondary | ICD-10-CM

## 2013-03-19 NOTE — Progress Notes (Signed)
   Subjective:    Patient ID: [name], female    DOB: 1960/04/15, 53 y.o.   MRN: [id number]  HPI Comments: '' LT FOOT IS STILL PAINFUL.''     Review of Systems     Objective:   Physical Exam: Vital signs are stable he is alert and oriented x3. At this point she has pain on palpation medial continued tubercle of the left heel.        Assessment & Plan:  Assessment: Plantar fasciitis residual in nature left.  Plan: Reinjected her left heel today we'll followup with her in one month continue all conservative therapies.

## 2013-03-26 ENCOUNTER — Telehealth: Payer: Self-pay | Admitting: *Deleted

## 2013-03-26 NOTE — Telephone Encounter (Signed)
Patient called inquiring about her night splint.  She wanted to know if she is to wear it all night and if she's supposed to use it with the wedge in place.  She also asked if she's supposed to use the ice gel pack for 20 minutes.  I advised her to not use the wedge for 30 days.  Wear the splint all night and use ice pack for about 15 minutes each time.

## 2013-04-07 ENCOUNTER — Encounter: Payer: Self-pay | Admitting: Podiatry

## 2013-04-07 ENCOUNTER — Ambulatory Visit (INDEPENDENT_AMBULATORY_CARE_PROVIDER_SITE_OTHER): Payer: BC Managed Care – PPO | Admitting: Podiatry

## 2013-04-07 VITALS — BP 114/79 | HR 85 | Resp 16

## 2013-04-07 DIAGNOSIS — M722 Plantar fascial fibromatosis: Secondary | ICD-10-CM

## 2013-04-07 MED ORDER — CELECOXIB 200 MG PO CAPS: 200.0000 mg | ORAL_CAPSULE | Freq: Every day | ORAL | Status: DC

## 2013-04-07 NOTE — Progress Notes (Signed)
It has its good days and bad days, its good if i am laying down. i want him to tell me its better .  Objective: Vital signs are stable she is alert and oriented x3. She has pain on palpation medial calcaneal tubercle of the left heel. Tenderness on range of motion of the second metatarsophalangeal joint.  Assessment: Plantar fasciitis left. Compensatory forefoot tenderness.  Plan: Injected left heel today continue all other conservative therapies scanned her for pair orthotics today.

## 2013-04-14 ENCOUNTER — Telehealth: Payer: Self-pay | Admitting: *Deleted

## 2013-04-14 NOTE — Telephone Encounter (Signed)
Stop the Celebrex and use ice instead and wait on orthotics.

## 2013-04-14 NOTE — Telephone Encounter (Addendum)
Pt states the Celebrex is upsetting her stomach, and still has tingling pain in the heel.  DR Hyatt's orders called to pt, I offered an earlier appt, pt states she doesn't know what else he can do the injections don't help.  I informed the pt her orthotic were not in, we would call once arrived.

## 2013-04-15 ENCOUNTER — Telehealth: Payer: Self-pay | Admitting: *Deleted

## 2013-04-15 DIAGNOSIS — M79609 Pain in unspecified limb: Secondary | ICD-10-CM

## 2013-04-15 NOTE — Telephone Encounter (Signed)
Pt states fell 45 minutes ago, her right leg gave out and she fell on her bad left heel.  Pt states felt a very sharp pain and she just knows that a piece of plastic isn't going to make this any better.  Pt called our office at 458pm and request pain medication.  I told pt, all narcotic pain medications needed to have a hand-carried prescription.  I checked the pt's medication list and pt was prescribed Mobic.  I asked her how she reacted to the Mobic, she stated it did not help.  I told pt she should try Mobic again, ice and rest.  I will advise Dr Al Corpus to see if he wants to have MRI.

## 2013-04-16 ENCOUNTER — Telehealth: Payer: Self-pay | Admitting: *Deleted

## 2013-04-16 DIAGNOSIS — M79609 Pain in unspecified limb: Secondary | ICD-10-CM

## 2013-04-16 MED ORDER — HYDROCODONE-ACETAMINOPHEN 5-325 MG PO TABS
1.0000 | ORAL_TABLET | Freq: Four times a day (QID) | ORAL | Status: DC | PRN
Start: 2013-04-16 — End: 2013-08-14

## 2013-04-16 NOTE — Telephone Encounter (Signed)
Left message on pt's cellphone informing her, Dr Al Corpus was ordering and MRI at [facility] and to call me concerning her pain medication.  Dr Al Corpus ordered Vicodin 5/325 #20 one every 6 hours prn pain.

## 2013-04-16 NOTE — Telephone Encounter (Signed)
Dr Geryl Rankins orders for MRI of left foot without contrast - done.

## 2013-04-16 NOTE — Telephone Encounter (Signed)
Informed pt of Dr Geryl Rankins orders for MRI.

## 2013-04-17 ENCOUNTER — Telehealth: Payer: Self-pay | Admitting: *Deleted

## 2013-04-17 NOTE — Telephone Encounter (Addendum)
Prior authorization for left foot MRI scheduled for 04/18/2013 is [id number] valid 04/17/2013 to 05/10/2013.  Prior authorization is faxed to [facility] [phone].

## 2013-04-18 ENCOUNTER — Ambulatory Visit
Admission: RE | Admit: 2013-04-18 | Discharge: 2013-04-18 | Disposition: A | Discharge status: Home / Self Care | Payer: BC Managed Care – PPO | Source: Ambulatory Visit | Attending: Podiatry | Admitting: Podiatry

## 2013-04-18 ENCOUNTER — Inpatient Hospital Stay: Admission: RE | Admit: 2013-04-18 | Payer: BC Managed Care – PPO | Source: Ambulatory Visit

## 2013-04-18 DIAGNOSIS — M79609 Pain in unspecified limb: Secondary | ICD-10-CM

## 2013-04-20 ENCOUNTER — Telehealth: Payer: Self-pay | Admitting: *Deleted

## 2013-04-20 NOTE — Telephone Encounter (Signed)
Pt states she had the MRI on Saturday, and was told the results would be in today.  I told pt I would present the results to DR [facility] on 04/21/2013 and call with instructions.

## 2013-04-21 NOTE — Telephone Encounter (Addendum)
Left message that I would call with MRI results.  I informed the pt of Dr Geryl Rankins orders and referred to the schedulers for an appt to pick up orthotics.

## 2013-04-21 NOTE — Telephone Encounter (Signed)
I have all ready directed you one this from the MRI report.

## 2013-04-21 NOTE — Telephone Encounter (Signed)
LEFT MSG FOR PT TO CALL AND SCHED [facility]O APPT

## 2013-04-21 NOTE — Telephone Encounter (Signed)
Message copied by Marissa Nestle on Tue Apr 21, 2013  8:41 AM ------      Message from: Ernestene Kiel T      Created: Tue Apr 21, 2013  7:19 AM       Dx is plantar fasciitis.  She will need orthotics if she does not currently have a pair.  Surgery would be the next option. ------

## 2013-04-23 ENCOUNTER — Other Ambulatory Visit: Payer: BC Managed Care – PPO

## 2013-04-24 ENCOUNTER — Ambulatory Visit: Payer: BC Managed Care – PPO | Admitting: *Deleted

## 2013-04-24 VITALS — BP 120/86 | HR 69 | Resp 12

## 2013-04-24 DIAGNOSIS — M722 Plantar fascial fibromatosis: Secondary | ICD-10-CM

## 2013-04-24 NOTE — Progress Notes (Signed)
   Subjective:    Patient ID: [name], female    DOB: [birth year], 53 y.o.   MRN: [id number]  HPI DISPENSED ORTHOTICS AND GIVEN INSTRUCTION.    Review of Systems     Objective:   Physical Exam        Assessment & Plan:

## 2013-04-24 NOTE — Patient Instructions (Signed)

## 2013-05-05 ENCOUNTER — Ambulatory Visit: Payer: BC Managed Care – PPO | Admitting: Podiatry

## 2013-05-21 ENCOUNTER — Ambulatory Visit (INDEPENDENT_AMBULATORY_CARE_PROVIDER_SITE_OTHER): Payer: BC Managed Care – PPO | Admitting: Podiatry

## 2013-05-21 ENCOUNTER — Encounter: Payer: Self-pay | Admitting: Podiatry

## 2013-05-21 VITALS — BP 114/79 | HR 85 | Resp 16

## 2013-05-21 DIAGNOSIS — M7750 Other enthesopathy of unspecified foot: Secondary | ICD-10-CM

## 2013-05-21 DIAGNOSIS — M722 Plantar fascial fibromatosis: Secondary | ICD-10-CM

## 2013-05-21 DIAGNOSIS — M779 Enthesopathy, unspecified: Secondary | ICD-10-CM

## 2013-05-21 DIAGNOSIS — M778 Other enthesopathies, not elsewhere classified: Secondary | ICD-10-CM

## 2013-05-22 NOTE — Progress Notes (Signed)
She presents today for followup of her plantar fasciitis of her left foot and left lateral compensatory syndrome. She continues to use of orthotics and states that they're doing very well and the pain is subsiding. But she still does have pain it is not as severe as it was previously she's also complaining of pain to the forefoot bilateral.  Objective: Vital signs are stable she is alert and oriented x3. She has no reproducible pain on palpation of the forefoot. She has no pain on palpation of the left calcaneus.  Assessment: Slowly resolving plantar fasciitis left foot lateral compensatory syndrome.  Plan: Continue the use of her orthotics a regular basis followup with me as needed.

## 2013-06-05 ENCOUNTER — Telehealth: Payer: Self-pay | Admitting: *Deleted

## 2013-06-05 NOTE — Telephone Encounter (Signed)
Have my orthotics come in yet:  Bottom of my foot is getting rough, tired and really painful.  Let me know if they're in.  I called and informed her they are not here yet.   She said the ball of her foot is killing her.  I offered to schedule her an appointment with Dr. Al Corpus.  She stated she would wait, if it gets any worse she will call.  I informed her we will let her know when they come in.

## 2013-06-23 ENCOUNTER — Ambulatory Visit (INDEPENDENT_AMBULATORY_CARE_PROVIDER_SITE_OTHER): Payer: BC Managed Care – PPO

## 2013-06-23 ENCOUNTER — Telehealth: Payer: Self-pay | Admitting: *Deleted

## 2013-06-23 ENCOUNTER — Ambulatory Visit (INDEPENDENT_AMBULATORY_CARE_PROVIDER_SITE_OTHER): Payer: BC Managed Care – PPO | Admitting: Podiatry

## 2013-06-23 ENCOUNTER — Encounter: Payer: Self-pay | Admitting: Podiatry

## 2013-06-23 VITALS — BP 120/84 | HR 70 | Resp 16

## 2013-06-23 DIAGNOSIS — M79609 Pain in unspecified limb: Secondary | ICD-10-CM

## 2013-06-23 DIAGNOSIS — M7750 Other enthesopathy of unspecified foot: Secondary | ICD-10-CM

## 2013-06-23 DIAGNOSIS — M778 Other enthesopathies, not elsewhere classified: Secondary | ICD-10-CM

## 2013-06-23 DIAGNOSIS — M779 Enthesopathy, unspecified: Secondary | ICD-10-CM

## 2013-06-23 DIAGNOSIS — M722 Plantar fascial fibromatosis: Secondary | ICD-10-CM

## 2013-06-23 DIAGNOSIS — M79674 Pain in right toe(s): Secondary | ICD-10-CM

## 2013-06-23 NOTE — Telephone Encounter (Signed)
Saw him about 15 minutes ago.  He gave me a surgical shoe.  Am I supposed to wear orthotic with the surgical shoe or not?  Can I wear a sock?  Can I take off to ice and to sleep?

## 2013-06-24 NOTE — Progress Notes (Signed)
She presents today for followup of her left heel pain in her right forefoot pain. She also here to pick up for orthotics. She states that the feet are not doing any good at all.  Objective: Vital signs are stable she is alert and oriented x3 pulses are palpable bilateral. She has pain on palpation medial continued tubercle of the left heel she has pain and edema on range of motion of the right foot at the metatarsophalangeal joint second right. Radiographs do not demonstrate any type of osseous abnormalities in this area.  Assessment: Capsulitis second metatarsophalangeal joint right foot plantar fasciitis left foot.  Plan: Injected left heel and injected the right second metatarsophalangeal joint with local anesthetic and Kenalog. Placed her in a Darco shoe and suggested she continue all anti-inflammatories and all other conservative therapies.

## 2013-06-24 NOTE — Telephone Encounter (Signed)
No orthotic. Yes you may wear a sock and you will need to take it off to sleep and to ice.

## 2013-06-24 NOTE — Telephone Encounter (Signed)
I returned her call, left a message not to wear orthotics with it.  Can wear a sock.  Can take it off to sleep and to ice per Dr. Al Corpus.

## 2013-06-25 ENCOUNTER — Ambulatory Visit: Payer: BC Managed Care – PPO | Admitting: Podiatry

## 2013-07-18 ENCOUNTER — Emergency Department (HOSPITAL_COMMUNITY): Payer: BC Managed Care – PPO

## 2013-07-18 ENCOUNTER — Emergency Department (HOSPITAL_COMMUNITY)
Admission: EM | Admit: 2013-07-18 | Discharge: 2013-07-18 | Disposition: A | Discharge status: Home / Self Care | Payer: BC Managed Care – PPO | Attending: Emergency Medicine | Admitting: Emergency Medicine

## 2013-07-18 ENCOUNTER — Encounter (HOSPITAL_COMMUNITY): Payer: Self-pay | Admitting: Emergency Medicine

## 2013-07-18 DIAGNOSIS — Z872 Personal history of diseases of the skin and subcutaneous tissue: Secondary | ICD-10-CM | POA: Insufficient documentation

## 2013-07-18 DIAGNOSIS — Z8719 Personal history of other diseases of the digestive system: Secondary | ICD-10-CM | POA: Insufficient documentation

## 2013-07-18 DIAGNOSIS — G8911 Acute pain due to trauma: Secondary | ICD-10-CM | POA: Insufficient documentation

## 2013-07-18 DIAGNOSIS — M5430 Sciatica, unspecified side: Secondary | ICD-10-CM | POA: Insufficient documentation

## 2013-07-18 DIAGNOSIS — Z79899 Other long term (current) drug therapy: Secondary | ICD-10-CM | POA: Insufficient documentation

## 2013-07-18 DIAGNOSIS — F341 Dysthymic disorder: Secondary | ICD-10-CM | POA: Insufficient documentation

## 2013-07-18 DIAGNOSIS — M545 Low back pain, unspecified: Secondary | ICD-10-CM | POA: Diagnosis present

## 2013-07-18 DIAGNOSIS — Z791 Long term (current) use of non-steroidal anti-inflammatories (NSAID): Secondary | ICD-10-CM | POA: Insufficient documentation

## 2013-07-18 DIAGNOSIS — M129 Arthropathy, unspecified: Secondary | ICD-10-CM | POA: Insufficient documentation

## 2013-07-18 DIAGNOSIS — W19XXXA Unspecified fall, initial encounter: Secondary | ICD-10-CM

## 2013-07-18 DIAGNOSIS — R209 Unspecified disturbances of skin sensation: Secondary | ICD-10-CM | POA: Insufficient documentation

## 2013-07-18 HISTORY — DX: Reserved for concepts with insufficient information to code with codable children: IMO0002

## 2013-07-18 HISTORY — DX: Other specified anxiety disorders: F41.8

## 2013-07-18 MED ORDER — OXYCODONE-ACETAMINOPHEN 5-325 MG PO TABS: 1.0000 | ORAL_TABLET | Freq: Four times a day (QID) | ORAL | Status: DC | PRN

## 2013-07-18 MED ORDER — DOCUSATE SODIUM 100 MG PO CAPS: 100.0000 mg | ORAL_CAPSULE | Freq: Every day | ORAL | Status: DC

## 2013-07-18 MED ORDER — HYDROMORPHONE HCL PF 1 MG/ML IJ SOLN
1.0000 mg | Freq: Once | INTRAMUSCULAR | Status: AC
  Administered 2013-07-18: 1 mg via INTRAMUSCULAR
  Filled 2013-07-18: qty 1

## 2013-07-18 MED ORDER — OXYCODONE-ACETAMINOPHEN 5-325 MG PO TABS
1.0000 | ORAL_TABLET | Freq: Once | ORAL | Status: AC
  Administered 2013-07-18: 1 via ORAL
  Filled 2013-07-18: qty 1

## 2013-07-18 NOTE — ED Notes (Signed)
Pt reports bilateral sciatic pain x3 weeks, progressively worsening. L side worse. Has seen [facility] twice. Given toradol IM last night at office. Recommended by PCP to come to ER today instead of waiting until Monday in office, recommended MRI to pt. PCP concerned for "compressions." Hx DJD.

## 2013-07-18 NOTE — ED Notes (Signed)
MD at bedside. 

## 2013-07-18 NOTE — ED Provider Notes (Signed)
CSN: [id number]     Arrival date & time 07/18/13  0904 History   First MD Initiated Contact with Patient 07/18/13 [id number]     Chief Complaint  Patient presents with  . Back Pain  . Sciatica     (Consider location/radiation/quality/duration/timing/severity/associated sxs/prior Treatment) Patient is a 53 y.o. female presenting with back pain.  Back Pain Location:  Lumbar spine Quality:  Aching and burning Radiates to:  Does not radiate Pain severity:  Severe Pain is:  Same all the time Onset quality:  Sudden Duration:  3 weeks Timing:  Constant Progression:  Worsening Chronicity:  New Context comment:  Mechanical fall Relieved by:  Nothing Exacerbated by: walking, sitting up. Ineffective treatments: hydrocodone, but only taking sporadically. Associated symptoms: paresthesias   Associated symptoms: no abdominal pain, no bladder incontinence, no bowel incontinence, no chest pain, no dysuria, no fever, no headaches, no perianal numbness and no weakness     Past Medical History  Diagnosis Date  . Ulcer   . Hernia   . Arthritis   . DDD (degenerative disc disease)   . Depression with anxiety     r/t death of mother   Past Surgical History  Procedure Laterality Date  . Abdominal hysterectomy    . Ovarian cyst surgery    . Rotator cuff repair     Family History  Problem Relation Age of Onset  . COPD Father    History  Substance Use Topics  . Smoking status: Never Smoker   . Smokeless tobacco: Never Used  . Alcohol Use: No   OB History   Grav Para Term Preterm Abortions TAB SAB Ect Mult Living                 Review of Systems  Constitutional: Negative for fever and fatigue.  HENT: Negative for congestion and drooling.   Eyes: Negative for pain.  Respiratory: Negative for cough and shortness of breath.   Cardiovascular: Negative for chest pain.  Gastrointestinal: Negative for nausea, vomiting, abdominal pain, diarrhea and bowel incontinence.  Genitourinary:  Negative for bladder incontinence, dysuria and hematuria.  Musculoskeletal: Positive for back pain. Negative for gait problem and neck pain.  Skin: Negative for color change.  Neurological: Positive for paresthesias. Negative for dizziness, weakness and headaches.       Paresthesias  Hematological: Negative for adenopathy.  Psychiatric/Behavioral: Negative for behavioral problems.  All other systems reviewed and are negative.     Allergies  Review of patient's allergies indicates no known allergies.  Home Medications   Prior to Admission medications   Medication Sig Start Date End Date Taking? Authorizing Provider  alprazolam Prudy Feeler) 2 MG tablet Take 2 mg by mouth at bedtime as needed for sleep.     Historical Provider, MD  buPROPion (WELLBUTRIN XL) 150 MG 24 hr tablet  05/15/13   Historical Provider, MD  celecoxib (CELEBREX) 200 MG capsule Take 1 capsule (200 mg total) by mouth daily. 04/07/13   Max T Hyatt, DPM  escitalopram (LEXAPRO) 10 MG tablet Take 10 mg by mouth at bedtime.    Historical Provider, MD  estradiol (ESTRACE) 0.5 MG tablet Take 0.5 mg by mouth 2 (two) times daily.     Historical Provider, MD  HALOG 0.1 % CREA  12/24/12   Historical Provider, MD  HYDROcodone-acetaminophen (NORCO) 5-325 MG per tablet Take 1 tablet by mouth every 6 (six) hours as needed for moderate pain. 04/16/13   Max T Hyatt, DPM  meloxicam (MOBIC) 15 MG tablet  Take 1 tablet (15 mg total) by mouth daily. 02/12/13   Max T Hyatt, DPM  methylPREDNIsolone (MEDROL DOSPACK) 4 MG tablet follow package directions 02/12/13   Max T [place], DPM  promethazine (PHENERGAN) 25 MG tablet  03/27/13   Historical Provider, MD  TAMIFLU 75 MG capsule  03/27/13   Historical Provider, MD  Vitamin D, Ergocalciferol, (DRISDOL) 50000 UNITS CAPS capsule  06/12/13   Historical Provider, MD  zolpidem (AMBIEN) 10 MG tablet  05/27/13   Historical Provider, MD   BP 101/56  Pulse 79  Temp(Src) 97.6 F (36.4 C) (Oral)  Resp 20 Physical  Exam  Nursing note and vitals reviewed. Constitutional: She is oriented to person, place, and time. She appears well-developed and well-nourished.  HENT:  Head: Normocephalic.  Mouth/Throat: No oropharyngeal exudate.  Eyes: Conjunctivae and EOM are normal. Pupils are equal, round, and reactive to light.  Neck: Normal range of motion. Neck supple.  Cardiovascular: Normal rate, regular rhythm, normal heart sounds and intact distal pulses.  Exam reveals no gallop and no friction rub.   No murmur heard. Pulmonary/Chest: Effort normal and breath sounds normal. No respiratory distress. She has no wheezes.  Abdominal: Soft. Bowel sounds are normal. There is no tenderness. There is no rebound and no guarding.  Musculoskeletal: Normal range of motion. She exhibits tenderness (moderate tenderness to palpation of the mid lumbar and lower lumbar vertebra. Bilateral lower lumbar paraspinal tenderness to palpation. Moderate tenderness to palpation over her coccyx/sacral area as well.). She exhibits no edema.  Neurological: She is alert and oriented to person, place, and time. She has normal strength. No sensory deficit. Coordination and gait normal.  Reflex Scores:      Patellar reflexes are 2+ on the right side and 2+ on the left side.      Achilles reflexes are 2+ on the right side and 2+ on the left side. Patient has normal strength and sensation in her bilateral lower extremities.  2+ distal pulses.  She is ambulatory.  Normal sensation of the posterior thighs and buttocks area to sharp and light touch.   Skin: Skin is warm and dry.  Psychiatric: She has a normal mood and affect. Her behavior is normal.    ED Course  Procedures (including critical care time) Labs Review Labs Reviewed - No data to display  Imaging Review Dg Lumbar Spine Complete  07/18/2013   CLINICAL DATA:  Fall 2-3 weeks ago. Persistent lower back/tailbone pain  EXAM: LUMBAR SPINE - COMPLETE 4+ VIEW  COMPARISON:   Concurrently obtained radiographs of the sacrum; prior lumbar spine radiographs 06/06/2011  FINDINGS: There is no evidence of lumbar spine fracture. Alignment is normal. Intervertebral disc spaces are maintained. Mild multilevel degenerative spurring. Mild loss of disc space at L5-S1.  IMPRESSION: 1. No acute fracture or malalignment. 2. Mild L5-S1 degenerative disc disease.   Electronically Signed   By: Malachy Moan M.D.   On: 07/18/2013 10:47   Dg Sacrum/coccyx  07/18/2013   CLINICAL DATA:  Larey Seat 2- 3 weeks ago, lower back and sacral pain  EXAM: SACRUM AND COCCYX - 2+ VIEW  COMPARISON:  Concurrently obtained radiographs of the lumbar spine  FINDINGS: There is no evidence of fracture or other focal bone lesions.  IMPRESSION: Negative.   Electronically Signed   By: Malachy Moan M.D.   On: 07/18/2013 11:04     EKG Interpretation None      MDM   Final diagnoses:  Low back pain  Fall  9:40 AM 53 y.o. female who presents with lower back pain which began 3 weeks ago after a mechanical fall. She states that she fell from standing onto her buttocks. She's had worsening lower back pain since that time. She denies any radiation of the pain down her leg but has developed some intermittent paresthesias down the posterior aspect of her left leg in the last few days. She states that she saw her primary care Dr. last week who prescribed her prednisone. She states that she saw her doctor again last night who gave her a shot of Toradol and recommended she come to the ER for an MRI. The patient is neurologically intact on my exam and ambulatory. She has no obvious weakness or numbness. She denies fever, bowel/bladder incont. No saddle anesthesia on my exam.  Will get pain control and start with screening plain films as she has had no imaging thus far.  11:31 AM: Imaging non-contrib. Pt feeling much better. Will recommend scheduled pain control. I do not think the patient needs an emergent MRI at this  time. Possible radiculopathy w/ paresthesias in LLE. May need outpt MRI. I have discussed the diagnosis/risks/treatment options with the patient and family and believe the pt to be eligible for discharge home to follow-up with pcp in 2 days. Family happy w/ plan. We also discussed returning to the ED immediately if new or worsening sx occur. We discussed the sx which are most concerning (e.g., discussed bp red flags -fever, weakness, numbness, bowel/bladder incont) that necessitate immediate return. Medications administered to the patient during their visit and any new prescriptions provided to the patient are listed below.  Medications given during this visit Medications  HYDROmorphone (DILAUDID) injection 1 mg (1 mg Intramuscular Given 07/18/13 0949)  oxyCODONE-acetaminophen (PERCOCET/ROXICET) 5-325 MG per tablet 1 tablet (1 tablet Oral Given 07/18/13 0949)    New Prescriptions   DOCUSATE SODIUM (COLACE) 100 MG CAPSULE    Take 1 capsule (100 mg total) by mouth daily.   OXYCODONE-ACETAMINOPHEN (PERCOCET) 5-325 MG PER TABLET    Take 1-2 tablets by mouth every 6 (six) hours as needed for moderate pain.     Junius Argyle, MD 07/18/13 1135

## 2013-07-21 ENCOUNTER — Ambulatory Visit: Payer: BC Managed Care – PPO | Admitting: Podiatry

## 2013-08-13 ENCOUNTER — Other Ambulatory Visit: Payer: Self-pay | Admitting: Orthopedic Surgery

## 2013-08-14 ENCOUNTER — Encounter (HOSPITAL_COMMUNITY): Payer: Self-pay | Admitting: Pharmacy Technician

## 2013-08-17 ENCOUNTER — Other Ambulatory Visit: Payer: Self-pay | Admitting: Orthopedic Surgery

## 2013-08-17 NOTE — H&P (Signed)
[name] is an 53 y.o. female.   Chief Complaint: back and R leg pain HPI: The patient is a 53 year old female who presents today for follow up of their back. The patient is being followed for their low back symptoms. They are now 6 week(s) out from when symptoms began. Symptoms reported today include: pain, numbness and leg pain (right). Current treatment includes: activity modification and pain medications. The following medication has been used for pain control: Oxycodone. The patient presents today following MRI. The patient has reported improvement of their symptoms with: corticosteroid use (only while taking it).  [name] follows up. She fell again yesterday. Somebody fell on her at work. She is having left lower sided symptoms. Now it is almost predominantly right lower extremity and into the outer aspect of the foot and heel. There is some numbness and tingling. Still taking up to 4 Oxycodone a day. She reports severe pain. Nothing seems to help it--home exercises, activity modification or stretches.  Past Medical History  Diagnosis Date  . Ulcer   . Hernia   . Arthritis   . DDD (degenerative disc disease)   . Depression with anxiety     r/t death of mother    Past Surgical History  Procedure Laterality Date  . Abdominal hysterectomy    . Ovarian cyst surgery    . Rotator cuff repair      Family History  Problem Relation Age of Onset  . COPD Father    Social History:  reports that she has never smoked. She has never used smokeless tobacco. She reports that she does not drink alcohol or use illicit drugs.  Allergies: No Known Allergies   (Not in a hospital admission)  No results found for this or any previous visit (from the past 48 hour(s)). No results found.  Review of Systems  Constitutional: Negative.   HENT: Negative.   Eyes: Negative.   Respiratory: Negative.   Cardiovascular: Negative.   Gastrointestinal: Negative.   Genitourinary:  Negative.   Musculoskeletal: Positive for back pain.  Skin: Negative.   Neurological: Positive for sensory change and focal weakness.  Psychiatric/Behavioral: Negative.     There were no vitals taken for this visit. Physical Exam  Constitutional: She is oriented to person, place, and time. She appears well-developed and well-nourished.  HENT:  Head: Normocephalic and atraumatic.  Eyes: Conjunctivae and EOM are normal. Pupils are equal, round, and reactive to light.  Neck: Normal range of motion. Neck supple.  Cardiovascular: Normal rate and regular rhythm.   Respiratory: Effort normal and breath sounds normal.  GI: Soft. Bowel sounds are normal.  Musculoskeletal:  On exam, she's in severe distress. Mood and affect are appropriate. She walks with a slight antalgic gait. SLR on the right produces marked buttock, thigh and calf pain exacerbated with dorsal augmentation maneuver, negative on the left. EHL is 5-/5 on the right compared to the left. Diminished repetitive plantar flexion on the right. Altered sensation in the S1 dermatome. Some discomfort with extension and flexion of the lumbar spine.  Lumbar spine exam reveals no evidence of soft tissue swelling, ecchymosis or deformity. The abdomen is soft and nontender. Nontender over the trochanters. No cellulitis or lymphadenopathy.  Motor is 5/5 including tibialis anterior, plantar flexion, quadriceps and hamstrings. Patient is normoreflexic. There is no Babinski or clonus. The patient has good distal pulses. No DVT. No pain and normal range of motion without instability of the hips, knees and ankles.  Neurological:   She is alert and oriented to person, place, and time. She has normal reflexes.  Skin: Skin is warm and dry.  Psychiatric: She has a normal mood and affect.     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  X-rays demonstrate disc degeneration at 5-1.  There are no modic changes at 5-1.  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  I had an extensive discussion with Vendetta concerning current pathology, relevant anatomy and treatment options. She reports she remains disabled by her symptomatology. Prior to this she had occasional back pain but nothing disabling. She now reports significant weakness and numbness in the lower extremities. Her main rupture is to the right at this point, consistent with her symptoms. Given the neural compressive lesion and the large disc herniation 6 weeks in duration despite Prednisone which gave temporary relief, activity modifications, she is unable to straighten the leg out or perform any type of activity.  We discussed lumbar decompression.  Risks and benefits of this procedure were discussed with the patient including worsening of symptoms, no changes in symptoms, recurrent disc herniation, scar tissue, epidural fibrosis, damage to neurovascular structures, cerebral spinal fluid leak which would require repair or patching, DVT, PE, anesthetic complications, etc. We discussed the perioperative course, the hospitalization, and the need for postoperative rehabilitation and the time estimate for recovery. We also discussed the possibility of future surgery including repeat decompression, fusion. The patient was provided an illustrated handout which was discussed in detail. Appropriate anatomic models were used as well.  I offered to take her out of work prior to that for 2-6 weeks postoperatively. We discussed the possibility of recurrent disc herniation of 15%, the probability of residual back pain requiring conservative treatment and the possibility of recurrent disc herniation  and progressive disc space collapse requiring fusion in the future. We mutually agreed fusion is not indicated at this point in time. I will proceed accordingly. With any changes in the interim, she's to call. I refilled her pain medicine, her Oxycodone. Refills by request.  Plan microlumbar decompression L5-S1  Dayna Barker. Sally Reimers PA-C for Dr. Shelle Iron 08/17/2013, 9:49 AM

## 2013-08-18 ENCOUNTER — Encounter (HOSPITAL_COMMUNITY): Payer: Self-pay

## 2013-08-18 ENCOUNTER — Ambulatory Visit (HOSPITAL_COMMUNITY)
Admission: RE | Admit: 2013-08-18 | Discharge: 2013-08-18 | Disposition: A | Discharge status: Home / Self Care | Payer: BC Managed Care – PPO | Source: Ambulatory Visit | Attending: Orthopedic Surgery | Admitting: Orthopedic Surgery

## 2013-08-18 ENCOUNTER — Encounter (HOSPITAL_COMMUNITY)
Admission: RE | Admit: 2013-08-18 | Discharge: 2013-08-18 | Disposition: A | Discharge status: Home / Self Care | Payer: BC Managed Care – PPO | Source: Ambulatory Visit | Attending: Specialist | Admitting: Specialist

## 2013-08-18 DIAGNOSIS — M51379 Other intervertebral disc degeneration, lumbosacral region without mention of lumbar back pain or lower extremity pain: Secondary | ICD-10-CM | POA: Insufficient documentation

## 2013-08-18 DIAGNOSIS — M5137 Other intervertebral disc degeneration, lumbosacral region: Secondary | ICD-10-CM | POA: Insufficient documentation

## 2013-08-18 DIAGNOSIS — Z01812 Encounter for preprocedural laboratory examination: Secondary | ICD-10-CM | POA: Insufficient documentation

## 2013-08-18 DIAGNOSIS — Z01818 Encounter for other preprocedural examination: Secondary | ICD-10-CM | POA: Insufficient documentation

## 2013-08-18 HISTORY — DX: Shortness of breath: R06.02

## 2013-08-18 HISTORY — DX: Headache: R51

## 2013-08-18 LAB — BASIC METABOLIC PANEL
BUN: 16 mg/dL (ref 6–23)
CO2: 25 mEq/L (ref 19–32)
CREATININE: 0.64 mg/dL (ref 0.50–1.10)
Calcium: 9.3 mg/dL (ref 8.4–10.5)
Chloride: 101 mEq/L (ref 96–112)
GFR calc Af Amer: >90 mL/min (ref >90)
GFR calc non Af Amer: >90 mL/min (ref >90)
GLUCOSE: 90 mg/dL (ref 70–99)
POTASSIUM: 4.5 meq/L (ref 3.7–5.3)
Sodium: 139 mEq/L (ref 137–147)

## 2013-08-18 LAB — CBC
HEMATOCRIT: 40.9 % (ref 36.0–46.0)
HEMOGLOBIN: 13.5 g/dL (ref 12.0–15.0)
MCH: 30.5 pg (ref 26.0–34.0)
MCHC: 33 g/dL (ref 30.0–36.0)
MCV: 92.3 fL (ref 78.0–100.0)
Platelets: 243 10*3/uL (ref 150–400)
RBC: 4.43 MIL/uL (ref 3.87–5.11)
RDW: 12.2 % (ref 11.5–15.5)
WBC: 8.7 10*3/uL (ref 4.0–10.5)

## 2013-08-18 LAB — SURGICAL PCR SCREEN
MRSA, PCR: NEGATIVE
Staphylococcus aureus: POSITIVE — AB

## 2013-08-18 NOTE — Patient Instructions (Signed)
YOUR SURGERY IS SCHEDULED AT [facility]  ON:   Wednesday  6/17  REPORT TO  SHORT STAY CENTER AT:  6:30 AM   PLEASE COME IN THE [facility] MAIN HOSPITAL ENTRANCE AND FOLLOW SIGNS TO SHORT STAY CENTER.  DO NOT EAT OR DRINK ANYTHING AFTER MIDNIGHT THE NIGHT BEFORE YOUR SURGERY.  YOU MAY BRUSH YOUR TEETH, RINSE OUT YOUR MOUTH--BUT NO WATER, NO FOOD, NO CHEWING GUM, NO MINTS, NO CANDIES, NO CHEWING TOBACCO.  PLEASE TAKE THE FOLLOWING MEDICATIONS THE AM OF YOUR SURGERY WITH A FEW SIPS OF WATER:  WELLBUTRIN, OXYCODONE   DO NOT BRING VALUABLES, MONEY, CREDIT CARDS.  DO NOT WEAR JEWELRY, MAKE-UP, NAIL POLISH AND NO METAL PINS OR CLIPS IN YOUR HAIR. CONTACT LENS, DENTURES / PARTIALS, GLASSES SHOULD NOT BE WORN TO SURGERY AND IN MOST CASES-HEARING AIDS WILL NEED TO BE REMOVED.  BRING YOUR GLASSES CASE, ANY EQUIPMENT NEEDED FOR YOUR CONTACT LENS. FOR PATIENTS ADMITTED TO THE HOSPITAL--CHECK OUT TIME THE DAY OF DISCHARGE IS 11:00 AM.  ALL INPATIENT ROOMS ARE PRIVATE - WITH BATHROOM, TELEPHONE, TELEVISION AND WIFI INTERNET.                                                    PLEASE READ OVER ANY  FACT SHEETS THAT YOU WERE GIVEN: MRSA INFORMATION, BLOOD TRANSFUSION INFORMATION, INCENTIVE SPIROMETER INFORMATION.  PLEASE BE AWARE THAT YOU MAY NEED ADDITIONAL BLOOD DRAWN DAY OF YOUR SURGERY  _______________________________________________________________________   [facility] - Preparing for Surgery Before surgery, you can play an important role.  Because skin is not sterile, your skin needs to be as free of germs as possible.  You can reduce the number of germs on your skin by washing with CHG (chlorahexidine gluconate) soap before surgery.  CHG is an antiseptic cleaner which kills germs and bonds with the skin to continue killing germs even after washing. Please DO NOT use if you have an allergy to CHG or antibacterial soaps.  If your skin becomes reddened/irritated stop using the CHG and  inform your nurse when you arrive at Short Stay. Do not shave (including legs and underarms) for at least 48 hours prior to the first CHG shower.  You may shave your face/neck. Please follow these instructions carefully:  1.  Shower with CHG Soap the night before surgery and the  morning of Surgery.  2.  If you choose to wash your hair, wash your hair first as usual with your  normal  shampoo.  3.  After you shampoo, rinse your hair and body thoroughly to remove the  shampoo.                           4.  Use CHG as you would any other liquid soap.  You can apply chg directly  to the skin and wash                       Gently with a scrungie or clean washcloth.  5.  Apply the CHG Soap to your body ONLY FROM THE NECK DOWN.   Do not use on face/ open                           Wound  or open sores. Avoid contact with eyes, ears mouth and genitals (private parts).                       Wash face,  Genitals (private parts) with your normal soap.             6.  Wash thoroughly, paying special attention to the area where your surgery  will be performed.  7.  Thoroughly rinse your body with warm water from the neck down.  8.  DO NOT shower/wash with your normal soap after using and rinsing off  the CHG Soap.                9.  Pat yourself dry with a clean towel.            10.  Wear clean pajamas.            11.  Place clean sheets on your bed the night of your first shower and do not  sleep with pets. Day of Surgery : Do not apply any lotions/deodorants the morning of surgery.  Please wear clean clothes to the hospital/surgery center.  FAILURE TO FOLLOW THESE INSTRUCTIONS MAY RESULT IN THE CANCELLATION OF YOUR SURGERY PATIENT SIGNATURE_________________________________  NURSE SIGNATURE__________________________________  ________________________________________________________________________   Rogelia Mire  An incentive spirometer is a tool that can help keep your lungs clear and  active. This tool measures how well you are filling your lungs with each breath. Taking long deep breaths may help reverse or decrease the chance of developing breathing (pulmonary) problems (especially infection) following:  A long period of time when you are unable to move or be active. BEFORE THE PROCEDURE   If the spirometer includes an indicator to show your best effort, your nurse or respiratory therapist will set it to a desired goal.  If possible, sit up straight or lean slightly forward. Try not to slouch.  Hold the incentive spirometer in an upright position. INSTRUCTIONS FOR USE  1. Sit on the edge of your bed if possible, or sit up as far as you can in bed or on a chair. 2. Hold the incentive spirometer in an upright position. 3. Breathe out normally. 4. Place the mouthpiece in your mouth and seal your lips tightly around it. 5. Breathe in slowly and as deeply as possible, raising the piston or the ball toward the top of the column. 6. Hold your breath for 3-5 seconds or for as long as possible. Allow the piston or ball to fall to the bottom of the column. 7. Remove the mouthpiece from your mouth and breathe out normally. 8. Rest for a few seconds and repeat Steps 1 through 7 at least 10 times every 1-2 hours when you are awake. Take your time and take a few normal breaths between deep breaths. 9. The spirometer may include an indicator to show your best effort. Use the indicator as a goal to work toward during each repetition. 10. After each set of 10 deep breaths, practice coughing to be sure your lungs are clear. If you have an incision (the cut made at the time of surgery), support your incision when coughing by placing a pillow or rolled up towels firmly against it. Once you are able to get out of bed, walk around indoors and cough well. You may stop using the incentive spirometer when instructed by your caregiver.  RISKS AND COMPLICATIONS  Take your time so you do not get  dizzy or light-headed.  If you are in pain, you may need to take or ask for pain medication before doing incentive spirometry. It is harder to take a deep breath if you are having pain. AFTER USE  Rest and breathe slowly and easily.  It can be helpful to keep track of a log of your progress. Your caregiver can provide you with a simple table to help with this. If you are using the spirometer at home, follow these instructions: [place] IF:   You are having difficultly using the spirometer.  You have trouble using the spirometer as often as instructed.  Your pain medication is not giving enough relief while using the spirometer.  You develop fever of 100.5 F (38.1 C) or higher. SEEK IMMEDIATE MEDICAL CARE IF:   You cough up bloody sputum that had not been present before.  You develop fever of 102 F (38.9 C) or greater.  You develop worsening pain at or near the incision site. MAKE SURE YOU:   Understand these instructions.  Will watch your condition.  Will get help right away if you are not doing well or get worse. Document Released: 07/09/2006 Document Revised: 05/21/2011 Document Reviewed: 09/09/2006 [facility] Patient Information 2014 [place], [place].   ________________________________________________________________________

## 2013-08-18 NOTE — Pre-Procedure Instructions (Signed)
PT'S HEART RATE 104 PREOP TODAY - SHE IS IN SEVERE PAIN WITH HER BACK - NO HX OF HEART PROBLEMS, CHEST PAIN, HYPERTENSION OR LUNG PROBLEMS.  B/P GOOD. EKG AND CXR NOT NEEDED PER ANESTHESIOLOGIST'S GUIDELINES. BACK XRAY WAS DONE TODAY PREOP.

## 2013-08-20 ENCOUNTER — Other Ambulatory Visit: Payer: Self-pay | Admitting: Orthopedic Surgery

## 2013-08-20 MED ORDER — DEXTROSE 5 % IV SOLN
900.0000 mg | INTRAVENOUS | Status: AC
  Administered 2013-08-26: 900 mg via INTRAVENOUS

## 2013-08-23 ENCOUNTER — Encounter (HOSPITAL_COMMUNITY): Payer: Self-pay | Admitting: Emergency Medicine

## 2013-08-23 ENCOUNTER — Emergency Department (HOSPITAL_COMMUNITY)
Admission: EM | Admit: 2013-08-23 | Discharge: 2013-08-23 | Disposition: A | Discharge status: Home / Self Care | Payer: BC Managed Care – PPO | Attending: Emergency Medicine | Admitting: Emergency Medicine

## 2013-08-23 DIAGNOSIS — Z8739 Personal history of other diseases of the musculoskeletal system and connective tissue: Secondary | ICD-10-CM | POA: Insufficient documentation

## 2013-08-23 DIAGNOSIS — F341 Dysthymic disorder: Secondary | ICD-10-CM | POA: Insufficient documentation

## 2013-08-23 DIAGNOSIS — Z79899 Other long term (current) drug therapy: Secondary | ICD-10-CM | POA: Insufficient documentation

## 2013-08-23 DIAGNOSIS — M545 Low back pain, unspecified: Secondary | ICD-10-CM

## 2013-08-23 DIAGNOSIS — Z872 Personal history of diseases of the skin and subcutaneous tissue: Secondary | ICD-10-CM | POA: Insufficient documentation

## 2013-08-23 DIAGNOSIS — Z8719 Personal history of other diseases of the digestive system: Secondary | ICD-10-CM | POA: Insufficient documentation

## 2013-08-23 MED ORDER — HYDROMORPHONE HCL PF 1 MG/ML IJ SOLN
1.0000 mg | Freq: Once | INTRAMUSCULAR | Status: AC
  Administered 2013-08-23: 1 mg via INTRAVENOUS
  Filled 2013-08-23: qty 1

## 2013-08-23 MED ORDER — KETOROLAC TROMETHAMINE 30 MG/ML IJ SOLN
30.0000 mg | Freq: Once | INTRAMUSCULAR | Status: AC
  Administered 2013-08-23: 30 mg via INTRAVENOUS
  Filled 2013-08-23: qty 1

## 2013-08-23 MED ORDER — FENTANYL 12 MCG/HR TD PT72
12.5000 ug | MEDICATED_PATCH | TRANSDERMAL | Status: DC
  Administered 2013-08-23: 12.5 ug via TRANSDERMAL
  Filled 2013-08-23: qty 1

## 2013-08-23 MED ORDER — ONDANSETRON HCL 4 MG/2ML IJ SOLN
4.0000 mg | Freq: Once | INTRAMUSCULAR | Status: AC
  Administered 2013-08-23: 4 mg via INTRAVENOUS
  Filled 2013-08-23: qty 2

## 2013-08-23 NOTE — ED Provider Notes (Signed)
CSN: [id number]     Arrival date & time 08/23/13  0820 History   First MD Initiated Contact with Patient 08/23/13 [phone]     No chief complaint on file.     HPI Patient reports increasing low back pain over the past 2 weeks.  She is scheduled for lumbar laminectomy in approximately 3.5 days with orthopedic surgery.  She has increased her oxycodone use over the past 24-36 hours up to 10 mg every 4 hours without improvement in her symptoms.  She's trying Flexeril without relief.  She called her orthopedic team today and they recommended that if she continues to have pain to go to the emergency department for IV pain control.  She denies fevers and chills.  No new weakness of her legs.  No bowel or bladder complaints.  Pain is moderate in severity worse with movement.   Past Medical History  Diagnosis Date  . Ulcer   . Hernia   . Arthritis   . DDD (degenerative disc disease)   . Depression with anxiety     r/t death of mother  . Shortness of breath     JUST WHEN I MOVE - RELATED TO MY BACK PAIN  . Headache(784.0)     MIGRAINES   Past Surgical History  Procedure Laterality Date  . Abdominal hysterectomy    . Ovarian cyst surgery    . Rotator cuff repair      RIGHT   Family History  Problem Relation Age of Onset  . COPD Father    History  Substance Use Topics  . Smoking status: Never Smoker   . Smokeless tobacco: Never Used  . Alcohol Use: No   OB History   Grav Para Term Preterm Abortions TAB SAB Ect Mult Living                 Review of Systems  All other systems reviewed and are negative.     Allergies  Review of patient's allergies indicates no known allergies.  Home Medications   Prior to Admission medications   Medication Sig Start Date End Date Taking? Authorizing Provider  alprazolam Prudy Feeler) 2 MG tablet Take 2 mg by mouth at bedtime as needed for sleep.    Yes Historical Provider, MD  buPROPion (WELLBUTRIN XL) 150 MG 24 hr tablet Take 150 mg by mouth every  morning.  05/15/13  Yes Historical Provider, MD  cyclobenzaprine (FLEXERIL) 5 MG tablet Take 5 mg by mouth at bedtime.   Yes Historical Provider, MD  escitalopram (LEXAPRO) 10 MG tablet Take 10 mg by mouth at bedtime.   Yes Historical Provider, MD  estradiol (ESTRACE) 0.5 MG tablet Take 0.5 mg by mouth 2 (two) times daily.    Yes Historical Provider, MD  mupirocin ointment (BACTROBAN) 2 % Apply 1 application topically 2 (two) times daily.  08/18/13  Yes Historical Provider, MD  oxyCODONE (OXY IR/ROXICODONE) 5 MG immediate release tablet Take 5 mg by mouth every 8 (eight) hours as needed for moderate pain or severe pain.   Yes Historical Provider, MD  Vitamin D, Ergocalciferol, (DRISDOL) 50000 UNITS CAPS capsule Take 50,000 Units by mouth every 7 (seven) days. Takes on Thursdays 06/12/13  Yes Historical Provider, MD  zolpidem (AMBIEN) 10 MG tablet Take 10 mg by mouth at bedtime as needed for sleep.  08/06/13  Yes Historical Provider, MD   BP 108/51  Pulse 87  Temp(Src) 97.6 F (36.4 C) (Oral)  Resp 16  SpO2 94% Physical Exam  Nursing note and vitals reviewed. Constitutional: She is oriented to person, place, and time. She appears well-developed and well-nourished. No distress.  HENT:  Head: Normocephalic and atraumatic.  Eyes: EOM are normal.  Neck: Normal range of motion.  Cardiovascular: Normal rate, regular rhythm and normal heart sounds.   Pulmonary/Chest: Effort normal and breath sounds normal.  Abdominal: Soft. She exhibits no distension. There is no tenderness.  Musculoskeletal: Normal range of motion.  5 out of 5 strength in bilateral lower extremity major muscle groups.  No significant lumbar tenderness.  Patient with paralumbar tenderness without spasm  Neurological: She is alert and oriented to person, place, and time.  Skin: Skin is warm and dry.  Psychiatric: She has a normal mood and affect. Judgment normal.    ED Course  Procedures (including critical care time) Labs  Review Labs Reviewed - No data to display  Imaging Review No results found.   EKG Interpretation None      MDM   Final diagnoses:  Low back pain    9:35 AM Patient's pain improved with analgesic medication in the emergency department.  I think the patient is a good candidate for a single fentanyl patch at 12.5 mcg per hour.  This is a low-dose fentanyl patch I think will significantly help her pain.  I explained to the patient that then she is able to take her oxycodone 5 mg every 4 hours when necessary pain.  I have had a long discussion with the patient's husband and talked about opioid effects and I have asked the husband to keep a close eye on her today since this will be a new continuous medication for her.  I've asked that he be has any concerns to remove the fentanyl patch and to rub her skin with alcohol.  He was given alcohol swabs to take home.  They're both educated and understands the pain medication I prescribed.  I've asked that she remove the fentanyl patch on Wednesday morning prior to surgery and to inform the anesthesia team of her recent fentanyl patch.    Lyanne Co, MD 08/23/13 [phone]

## 2013-08-23 NOTE — ED Notes (Signed)
She states that she has had bilat. Thigh pain and paresthesias with "cramping" all night last night.  She states she has a known "bulging disc" and is lated for surgery with Dr. Shelle Iron June 17.  She phoned Dr. Lequita Halt this morning who advised her to come to E.D. For eval. Her husband is with her and she is in no distress.

## 2013-08-23 NOTE — ED Notes (Addendum)
Pt was seen in the ED on 6/9 for same complaint. Pt reports bilateral sciatic pain x3 weeks, progressively worsening. Reports having back surgery on 6/17. Pain 9/10. Pt now has difficulty walking, scooting on the floor.   Pt called orthopedist this morning. Was told to come to ED and possibly get pain medication to hold her over till Wednesday 6/17

## 2013-08-23 NOTE — ED Notes (Signed)
Pharmacy to send Fentanyl patch

## 2013-08-26 ENCOUNTER — Encounter (HOSPITAL_COMMUNITY)
Admission: RE | Disposition: A | Discharge status: Home / Self Care | Payer: Self-pay | Source: Ambulatory Visit | Attending: Specialist

## 2013-08-26 ENCOUNTER — Ambulatory Visit (HOSPITAL_COMMUNITY): Payer: BC Managed Care – PPO

## 2013-08-26 ENCOUNTER — Encounter (HOSPITAL_COMMUNITY): Payer: BC Managed Care – PPO | Admitting: Certified Registered Nurse Anesthetist

## 2013-08-26 ENCOUNTER — Ambulatory Visit (HOSPITAL_COMMUNITY)
Admission: RE | Admit: 2013-08-26 | Discharge: 2013-08-27 | Disposition: A | Discharge status: Home / Self Care | Payer: BC Managed Care – PPO | Source: Ambulatory Visit | Attending: Specialist | Admitting: Specialist

## 2013-08-26 ENCOUNTER — Ambulatory Visit (HOSPITAL_COMMUNITY): Payer: BC Managed Care – PPO | Admitting: Certified Registered Nurse Anesthetist

## 2013-08-26 ENCOUNTER — Encounter (HOSPITAL_COMMUNITY): Payer: Self-pay

## 2013-08-26 DIAGNOSIS — IMO0002 Reserved for concepts with insufficient information to code with codable children: Secondary | ICD-10-CM

## 2013-08-26 DIAGNOSIS — M5126 Other intervertebral disc displacement, lumbar region: Secondary | ICD-10-CM | POA: Insufficient documentation

## 2013-08-26 DIAGNOSIS — Z90710 Acquired absence of both cervix and uterus: Secondary | ICD-10-CM | POA: Insufficient documentation

## 2013-08-26 HISTORY — PX: LUMBAR LAMINECTOMY/DECOMPRESSION MICRODISCECTOMY: SHX5026

## 2013-08-26 SURGERY — LUMBAR LAMINECTOMY/DECOMPRESSION MICRODISCECTOMY 1 LEVEL
Anesthesia: General | Site: Back

## 2013-08-26 MED ORDER — ROCURONIUM BROMIDE 100 MG/10ML IV SOLN
INTRAVENOUS | Status: DC | PRN
  Administered 2013-08-26: 40 mg via INTRAVENOUS

## 2013-08-26 MED ORDER — OXYCODONE-ACETAMINOPHEN 5-325 MG PO TABS
1.0000 | ORAL_TABLET | ORAL | Status: DC | PRN
  Filled 2013-08-26: qty 2

## 2013-08-26 MED ORDER — PROPOFOL 10 MG/ML IV BOLUS
INTRAVENOUS | Status: AC
  Filled 2013-08-26: qty 20

## 2013-08-26 MED ORDER — ACETAMINOPHEN 325 MG PO TABS: 650.0000 mg | ORAL_TABLET | ORAL | Status: DC | PRN

## 2013-08-26 MED ORDER — ACETAMINOPHEN 10 MG/ML IV SOLN
1000.0000 mg | Freq: Once | INTRAVENOUS | Status: AC
  Administered 2013-08-26: 1000 mg via INTRAVENOUS
  Filled 2013-08-26: qty 100

## 2013-08-26 MED ORDER — ALPRAZOLAM 1 MG PO TABS
2.0000 mg | ORAL_TABLET | Freq: Every evening | ORAL | Status: DC | PRN
  Administered 2013-08-26: 2 mg via ORAL
  Filled 2013-08-26: qty 2

## 2013-08-26 MED ORDER — HYDROMORPHONE HCL PF 1 MG/ML IJ SOLN
INTRAMUSCULAR | Status: DC | PRN
  Administered 2013-08-26 (×4): 0.5 mg via INTRAVENOUS

## 2013-08-26 MED ORDER — MENTHOL 3 MG MT LOZG: 1.0000 | LOZENGE | OROMUCOSAL | Status: DC | PRN

## 2013-08-26 MED ORDER — DEXAMETHASONE SODIUM PHOSPHATE 10 MG/ML IJ SOLN
INTRAMUSCULAR | Status: DC | PRN
  Administered 2013-08-26: 10 mg via INTRAVENOUS

## 2013-08-26 MED ORDER — CEFAZOLIN SODIUM-DEXTROSE 2-3 GM-% IV SOLR
2.0000 g | INTRAVENOUS | Status: AC
Start: 2013-08-26 — End: 2013-08-26
  Administered 2013-08-26: 2 g via INTRAVENOUS

## 2013-08-26 MED ORDER — HYDROMORPHONE HCL PF 2 MG/ML IJ SOLN
INTRAMUSCULAR | Status: AC
  Filled 2013-08-26: qty 1

## 2013-08-26 MED ORDER — OXYCODONE HCL 5 MG PO TABS
5.0000 mg | ORAL_TABLET | ORAL | Status: DC | PRN
  Administered 2013-08-26 – 2013-08-27 (×8): 10 mg via ORAL
  Filled 2013-08-26 (×8): qty 2

## 2013-08-26 MED ORDER — SODIUM CHLORIDE 0.9 % IJ SOLN: 3.0000 mL | INTRAMUSCULAR | Status: DC | PRN

## 2013-08-26 MED ORDER — ACETAMINOPHEN 650 MG RE SUPP: 650.0000 mg | RECTAL | Status: DC | PRN

## 2013-08-26 MED ORDER — THROMBIN 5000 UNITS EX SOLR
CUTANEOUS | Status: AC
  Filled 2013-08-26: qty 10000

## 2013-08-26 MED ORDER — GELATIN ABSORBABLE MT POWD
OROMUCOSAL | Status: DC | PRN
  Administered 2013-08-26: 09:00:00 via TOPICAL

## 2013-08-26 MED ORDER — HYDROMORPHONE HCL PF 1 MG/ML IJ SOLN
0.2500 mg | INTRAMUSCULAR | Status: DC | PRN
Start: 2013-08-26 — End: 2013-08-26
  Administered 2013-08-26 (×4): 0.5 mg via INTRAVENOUS

## 2013-08-26 MED ORDER — BUPIVACAINE-EPINEPHRINE (PF) 0.5% -1:200000 IJ SOLN
INTRAMUSCULAR | Status: AC
  Filled 2013-08-26: qty 30

## 2013-08-26 MED ORDER — CEFAZOLIN SODIUM-DEXTROSE 2-3 GM-% IV SOLR
2.0000 g | Freq: Three times a day (TID) | INTRAVENOUS | Status: AC
  Administered 2013-08-26 (×2): 2 g via INTRAVENOUS
  Filled 2013-08-26 (×2): qty 50

## 2013-08-26 MED ORDER — CEFAZOLIN SODIUM-DEXTROSE 2-3 GM-% IV SOLR
INTRAVENOUS | Status: AC
  Filled 2013-08-26: qty 50

## 2013-08-26 MED ORDER — BUPIVACAINE-EPINEPHRINE 0.5% -1:200000 IJ SOLN
INTRAMUSCULAR | Status: DC | PRN
  Administered 2013-08-26: 16 mL

## 2013-08-26 MED ORDER — PROPOFOL 10 MG/ML IV BOLUS
INTRAVENOUS | Status: DC | PRN
  Administered 2013-08-26: 200 mg via INTRAVENOUS

## 2013-08-26 MED ORDER — BUPROPION HCL ER (XL) 150 MG PO TB24
150.0000 mg | ORAL_TABLET | Freq: Every morning | ORAL | Status: DC
  Administered 2013-08-27: 150 mg via ORAL
  Filled 2013-08-26: qty 1

## 2013-08-26 MED ORDER — GLYCOPYRROLATE 0.2 MG/ML IJ SOLN
INTRAMUSCULAR | Status: DC | PRN
  Administered 2013-08-26: 0.6 mg via INTRAVENOUS

## 2013-08-26 MED ORDER — PHENOL 1.4 % MT LIQD: 1.0000 | OROMUCOSAL | Status: DC | PRN

## 2013-08-26 MED ORDER — MIDAZOLAM HCL 2 MG/2ML IJ SOLN
INTRAMUSCULAR | Status: AC
  Filled 2013-08-26: qty 2

## 2013-08-26 MED ORDER — NEOSTIGMINE METHYLSULFATE 10 MG/10ML IV SOLN
INTRAVENOUS | Status: DC | PRN
  Administered 2013-08-26: 4 mg via INTRAVENOUS

## 2013-08-26 MED ORDER — PANTOPRAZOLE SODIUM 40 MG IV SOLR
40.0000 mg | Freq: Every day | INTRAVENOUS | Status: DC
  Administered 2013-08-26: 40 mg via INTRAVENOUS
  Filled 2013-08-26 (×2): qty 40

## 2013-08-26 MED ORDER — EPHEDRINE SULFATE 50 MG/ML IJ SOLN
INTRAMUSCULAR | Status: AC
  Filled 2013-08-26: qty 1

## 2013-08-26 MED ORDER — DOCUSATE SODIUM 100 MG PO CAPS: 100.0000 mg | ORAL_CAPSULE | Freq: Two times a day (BID) | ORAL | Status: DC | PRN

## 2013-08-26 MED ORDER — KCL IN DEXTROSE-NACL 20-5-0.45 MEQ/L-%-% IV SOLN
INTRAVENOUS | Status: AC
  Administered 2013-08-26: 75 mL/h via INTRAVENOUS
  Administered 2013-08-27: 04:00:00 via INTRAVENOUS
  Filled 2013-08-26 (×2): qty 1000

## 2013-08-26 MED ORDER — LACTATED RINGERS IV SOLN
INTRAVENOUS | Status: DC
  Administered 2013-08-26 (×3): via INTRAVENOUS

## 2013-08-26 MED ORDER — HYDROCODONE-ACETAMINOPHEN 5-325 MG PO TABS: 1.0000 | ORAL_TABLET | ORAL | Status: DC | PRN

## 2013-08-26 MED ORDER — HYDROMORPHONE HCL PF 1 MG/ML IJ SOLN
INTRAMUSCULAR | Status: AC
  Filled 2013-08-26: qty 1

## 2013-08-26 MED ORDER — ATROPINE SULFATE 0.4 MG/ML IJ SOLN
INTRAMUSCULAR | Status: AC
  Filled 2013-08-26: qty 1

## 2013-08-26 MED ORDER — GLYCOPYRROLATE 0.2 MG/ML IJ SOLN
INTRAMUSCULAR | Status: AC
  Filled 2013-08-26: qty 3

## 2013-08-26 MED ORDER — SODIUM CHLORIDE 0.9 % IR SOLN
Status: DC | PRN
  Administered 2013-08-26: 09:00:00

## 2013-08-26 MED ORDER — ROCURONIUM BROMIDE 100 MG/10ML IV SOLN
INTRAVENOUS | Status: AC
  Filled 2013-08-26: qty 1

## 2013-08-26 MED ORDER — ESTRADIOL 1 MG PO TABS
0.5000 mg | ORAL_TABLET | Freq: Two times a day (BID) | ORAL | Status: DC
  Administered 2013-08-26 – 2013-08-27 (×2): 0.5 mg via ORAL
  Filled 2013-08-26 (×3): qty 0.5

## 2013-08-26 MED ORDER — ESCITALOPRAM OXALATE 10 MG PO TABS
10.0000 mg | ORAL_TABLET | Freq: Every day | ORAL | Status: DC
  Administered 2013-08-26: 10 mg via ORAL
  Filled 2013-08-26 (×2): qty 1

## 2013-08-26 MED ORDER — HYDROMORPHONE HCL PF 1 MG/ML IJ SOLN
INTRAMUSCULAR | Status: AC
  Administered 2013-08-26: 1 mg via INTRAVENOUS
  Filled 2013-08-26: qty 1

## 2013-08-26 MED ORDER — DEXAMETHASONE SODIUM PHOSPHATE 10 MG/ML IJ SOLN
INTRAMUSCULAR | Status: AC
  Filled 2013-08-26: qty 1

## 2013-08-26 MED ORDER — PROMETHAZINE HCL 25 MG/ML IJ SOLN: 6.2500 mg | INTRAMUSCULAR | Status: DC | PRN

## 2013-08-26 MED ORDER — OXYCODONE-ACETAMINOPHEN 7.5-325 MG PO TABS: 1.0000 | ORAL_TABLET | ORAL | Status: DC | PRN

## 2013-08-26 MED ORDER — SODIUM CHLORIDE 0.9 % IV SOLN: 250.0000 mL | INTRAVENOUS | Status: DC

## 2013-08-26 MED ORDER — LIDOCAINE HCL (CARDIAC) 20 MG/ML IV SOLN
INTRAVENOUS | Status: DC | PRN
  Administered 2013-08-26: 80 mg via INTRAVENOUS

## 2013-08-26 MED ORDER — ZOLPIDEM TARTRATE 5 MG PO TABS
5.0000 mg | ORAL_TABLET | Freq: Every evening | ORAL | Status: DC | PRN
  Administered 2013-08-26: 5 mg via ORAL
  Filled 2013-08-26: qty 1

## 2013-08-26 MED ORDER — SODIUM CHLORIDE 0.9 % IJ SOLN
INTRAMUSCULAR | Status: AC
  Filled 2013-08-26: qty 10

## 2013-08-26 MED ORDER — SUCCINYLCHOLINE CHLORIDE 20 MG/ML IJ SOLN
INTRAMUSCULAR | Status: DC | PRN
  Administered 2013-08-26: 100 mg via INTRAVENOUS

## 2013-08-26 MED ORDER — FENTANYL CITRATE 0.05 MG/ML IJ SOLN
INTRAMUSCULAR | Status: DC | PRN
  Administered 2013-08-26 (×5): 50 ug via INTRAVENOUS

## 2013-08-26 MED ORDER — ONDANSETRON HCL 4 MG/2ML IJ SOLN
4.0000 mg | INTRAMUSCULAR | Status: DC | PRN
  Administered 2013-08-26: 4 mg via INTRAVENOUS
  Filled 2013-08-26: qty 2

## 2013-08-26 MED ORDER — SODIUM CHLORIDE 0.9 % IJ SOLN: 3.0000 mL | Freq: Two times a day (BID) | INTRAMUSCULAR | Status: DC

## 2013-08-26 MED ORDER — MIDAZOLAM HCL 5 MG/5ML IJ SOLN
INTRAMUSCULAR | Status: DC | PRN
  Administered 2013-08-26: 2 mg via INTRAVENOUS

## 2013-08-26 MED ORDER — DOCUSATE SODIUM 100 MG PO CAPS
100.0000 mg | ORAL_CAPSULE | Freq: Two times a day (BID) | ORAL | Status: DC
  Administered 2013-08-26 – 2013-08-27 (×2): 100 mg via ORAL

## 2013-08-26 MED ORDER — ONDANSETRON HCL 4 MG/2ML IJ SOLN
INTRAMUSCULAR | Status: DC | PRN
  Administered 2013-08-26: 4 mg via INTRAVENOUS

## 2013-08-26 MED ORDER — NEOSTIGMINE METHYLSULFATE 10 MG/10ML IV SOLN
INTRAVENOUS | Status: AC
  Filled 2013-08-26: qty 1

## 2013-08-26 MED ORDER — CLINDAMYCIN PHOSPHATE 900 MG/50ML IV SOLN
INTRAVENOUS | Status: AC
  Filled 2013-08-26: qty 50

## 2013-08-26 MED ORDER — ONDANSETRON HCL 4 MG/2ML IJ SOLN
INTRAMUSCULAR | Status: AC
  Filled 2013-08-26: qty 2

## 2013-08-26 MED ORDER — HYDROMORPHONE HCL PF 1 MG/ML IJ SOLN
0.5000 mg | INTRAMUSCULAR | Status: DC | PRN
  Administered 2013-08-26: 0.5 mg via INTRAVENOUS
  Administered 2013-08-26 – 2013-08-27 (×2): 1 mg via INTRAVENOUS
  Filled 2013-08-26 (×3): qty 1

## 2013-08-26 MED ORDER — LIDOCAINE HCL (CARDIAC) 20 MG/ML IV SOLN
INTRAVENOUS | Status: AC
  Filled 2013-08-26: qty 5

## 2013-08-26 MED ORDER — FENTANYL CITRATE 0.05 MG/ML IJ SOLN
INTRAMUSCULAR | Status: AC
  Filled 2013-08-26: qty 5

## 2013-08-26 MED ORDER — THROMBIN 5000 UNITS EX SOLR
CUTANEOUS | Status: DC | PRN
  Administered 2013-08-26: 5000 [IU] via TOPICAL

## 2013-08-26 MED ORDER — METHOCARBAMOL 500 MG PO TABS: 500.0000 mg | ORAL_TABLET | Freq: Three times a day (TID) | ORAL | Status: DC | PRN

## 2013-08-26 MED ORDER — METHOCARBAMOL 1000 MG/10ML IJ SOLN
500.0000 mg | Freq: Three times a day (TID) | INTRAVENOUS | Status: DC
  Administered 2013-08-26: 500 mg via INTRAVENOUS
  Filled 2013-08-26 (×6): qty 5

## 2013-08-26 SURGICAL SUPPLY — 43 items
BAG ZIPLOCK 12X15 (MISCELLANEOUS) IMPLANT
CLEANER TIP ELECTROSURG 2X2 (MISCELLANEOUS) ×2 IMPLANT
CLOTH 2% CHLOROHEXIDINE 3PK (PERSONAL CARE ITEMS) ×2 IMPLANT
DRAPE MICROSCOPE LEICA (MISCELLANEOUS) ×2 IMPLANT
DRAPE POUCH INSTRU U-SHP 10X18 (DRAPES) ×2 IMPLANT
DRAPE SURG 17X11 SM STRL (DRAPES) ×2 IMPLANT
DRAPE UTILITY XL STRL (DRAPES) ×2 IMPLANT
DRSG AQUACEL AG ADV 3.5X 4 (GAUZE/BANDAGES/DRESSINGS) IMPLANT
DRSG AQUACEL AG ADV 3.5X 6 (GAUZE/BANDAGES/DRESSINGS) ×2 IMPLANT
DURAPREP 26ML APPLICATOR (WOUND CARE) ×2 IMPLANT
DURASEAL SPINE SEALANT 3ML (MISCELLANEOUS) IMPLANT
ELECT BLADE TIP CTD 4 INCH (ELECTRODE) ×2 IMPLANT
ELECT REM PT RETURN 9FT ADLT (ELECTROSURGICAL) ×2
ELECTRODE REM PT RTRN 9FT ADLT (ELECTROSURGICAL) ×1 IMPLANT
GLOVE BIOGEL PI IND STRL 7.5 (GLOVE) ×1 IMPLANT
GLOVE BIOGEL PI INDICATOR 7.5 (GLOVE) ×1
GLOVE SURG SS PI 7.5 STRL IVOR (GLOVE) ×2 IMPLANT
GLOVE SURG SS PI 8.0 STRL IVOR (GLOVE) ×4 IMPLANT
GOWN STRL REUS W/TWL XL LVL3 (GOWN DISPOSABLE) ×4 IMPLANT
IV CATH 14GX2 1/4 (CATHETERS) ×2 IMPLANT
KIT BASIN OR (CUSTOM PROCEDURE TRAY) ×2 IMPLANT
KIT POSITIONING SURG ANDREWS (MISCELLANEOUS) ×2 IMPLANT
MANIFOLD NEPTUNE II (INSTRUMENTS) ×2 IMPLANT
NEEDLE SPNL 18GX3.5 QUINCKE PK (NEEDLE) ×4 IMPLANT
PATTIES SURGICAL .5 X.5 (GAUZE/BANDAGES/DRESSINGS) IMPLANT
PATTIES SURGICAL .75X.75 (GAUZE/BANDAGES/DRESSINGS) IMPLANT
PATTIES SURGICAL 1X1 (DISPOSABLE) IMPLANT
SPONGE SURGIFOAM ABS GEL 100 (HEMOSTASIS) ×2 IMPLANT
STAPLER VISISTAT (STAPLE) IMPLANT
STRIP CLOSURE SKIN 1/2X4 (GAUZE/BANDAGES/DRESSINGS) ×2 IMPLANT
SUT NURALON 4 0 TR CR/8 (SUTURE) IMPLANT
SUT PROLENE 3 0 PS 2 (SUTURE) ×2 IMPLANT
SUT VIC AB 1 CT1 27 (SUTURE) ×1
SUT VIC AB 1 CT1 27XBRD ANTBC (SUTURE) ×1 IMPLANT
SUT VIC AB 1-0 CT2 27 (SUTURE) ×2 IMPLANT
SUT VIC AB 2-0 CT1 27 (SUTURE)
SUT VIC AB 2-0 CT1 TAPERPNT 27 (SUTURE) IMPLANT
SUT VIC AB 2-0 CT2 27 (SUTURE) ×2 IMPLANT
SYR 3ML LL SCALE MARK (SYRINGE) IMPLANT
TOWEL OR 17X26 10 PK STRL BLUE (TOWEL DISPOSABLE) ×2 IMPLANT
TOWEL OR NON WOVEN STRL DISP B (DISPOSABLE) ×2 IMPLANT
TRAY LAMINECTOMY (CUSTOM PROCEDURE TRAY) ×2 IMPLANT
YANKAUER SUCT BULB TIP NO VENT (SUCTIONS) ×2 IMPLANT

## 2013-08-26 NOTE — Anesthesia Preprocedure Evaluation (Addendum)
Anesthesia Evaluation  Patient identified by MRN, date of birth, ID band Patient awake    Reviewed: Allergy & Precautions, H&P , NPO status , Patient's Chart, lab work & pertinent test results  Airway Mallampati: II TM Distance: >3 FB Neck ROM: Full    Dental no notable dental hx.    Pulmonary neg pulmonary ROS,  breath sounds clear to auscultation  Pulmonary exam normal       Cardiovascular negative cardio ROS  Rhythm:Regular Rate:Normal     Neuro/Psych Anxiety negative neurological ROS     GI/Hepatic negative GI ROS, Neg liver ROS,   Endo/Other  negative endocrine ROS  Renal/GU negative Renal ROS  negative genitourinary   Musculoskeletal negative musculoskeletal ROS (+)   Abdominal   Peds negative pediatric ROS (+)  Hematology negative hematology ROS (+)   Anesthesia Other Findings   Reproductive/Obstetrics negative OB ROS                           Anesthesia Physical Anesthesia Plan  ASA: III  Anesthesia Plan: General   Post-op Pain Management:    Induction: Intravenous  Airway Management Planned: Oral ETT  Additional Equipment:   Intra-op Plan:   Post-operative Plan: Extubation in OR  Informed Consent: I have reviewed the patients History and Physical, chart, labs and discussed the procedure including the risks, benefits and alternatives for the proposed anesthesia with the patient or authorized representative who has indicated his/her understanding and acceptance.   Dental advisory given  Plan Discussed with: CRNA and Surgeon  Anesthesia Plan Comments:       Anesthesia Quick Evaluation

## 2013-08-26 NOTE — Anesthesia Postprocedure Evaluation (Signed)
  Anesthesia Post-op Note  Patient: [name]  Procedure(s) Performed: Procedure(s) (LRB): MICRO LUMBER DECOMPRESSION L5-S1 BILATERAL (N/A)  Patient Location: PACU  Anesthesia Type: General  Level of Consciousness: awake and alert   Airway and Oxygen Therapy: Patient Spontanous Breathing  Post-op Pain: mild  Post-op Assessment: Post-op Vital signs reviewed, Patient's Cardiovascular Status Stable, Respiratory Function Stable, Patent Airway and No signs of Nausea or vomiting  Last Vitals:  Filed Vitals:   08/26/13 1508  BP: 105/68  Pulse: 91  Temp: 36.4 C  Resp: 15    Post-op Vital Signs: stable   Complications: No apparent anesthesia complications

## 2013-08-26 NOTE — H&P (View-Only) (Signed)
[name] is an 53 y.o. female.   Chief Complaint: back and R leg pain HPI: The patient is a 53 year old female who presents today for follow up of their back. The patient is being followed for their low back symptoms. They are now 6 week(s) out from when symptoms began. Symptoms reported today include: pain, numbness and leg pain (right). Current treatment includes: activity modification and pain medications. The following medication has been used for pain control: Oxycodone. The patient presents today following MRI. The patient has reported improvement of their symptoms with: corticosteroid use (only while taking it).  [employer] follows up. She fell again yesterday. Somebody fell on her at work. She is having left lower sided symptoms. Now it is almost predominantly right lower extremity and into the outer aspect of the foot and heel. There is some numbness and tingling. Still taking up to 4 Oxycodone a day. She reports severe pain. Nothing seems to help it--home exercises, activity modification or stretches.  Past Medical History  Diagnosis Date  . Ulcer   . Hernia   . Arthritis   . DDD (degenerative disc disease)   . Depression with anxiety     r/t death of mother    Past Surgical History  Procedure Laterality Date  . Abdominal hysterectomy    . Ovarian cyst surgery    . Rotator cuff repair      Family History  Problem Relation Age of Onset  . COPD Father    Social History:  reports that she has never smoked. She has never used smokeless tobacco. She reports that she does not drink alcohol or use illicit drugs.  Allergies: No Known Allergies   (Not in a hospital admission)  No results found for this or any previous visit (from the past 48 hour(s)). No results found.  Review of Systems  Constitutional: Negative.   HENT: Negative.   Eyes: Negative.   Respiratory: Negative.   Cardiovascular: Negative.   Gastrointestinal: Negative.   Genitourinary:  Negative.   Musculoskeletal: Positive for back pain.  Skin: Negative.   Neurological: Positive for sensory change and focal weakness.  Psychiatric/Behavioral: Negative.     There were no vitals taken for this visit. Physical Exam  Constitutional: She is oriented to person, place, and time. She appears well-developed and well-nourished.  HENT:  Head: Normocephalic and atraumatic.  Eyes: Conjunctivae and EOM are normal. Pupils are equal, round, and reactive to light.  Neck: Normal range of motion. Neck supple.  Cardiovascular: Normal rate and regular rhythm.   Respiratory: Effort normal and breath sounds normal.  GI: Soft. Bowel sounds are normal.  Musculoskeletal:  On exam, she's in severe distress. Mood and affect are appropriate. She walks with a slight antalgic gait. SLR on the right produces marked buttock, thigh and calf pain exacerbated with dorsal augmentation maneuver, negative on the left. EHL is 5-/5 on the right compared to the left. Diminished repetitive plantar flexion on the right. Altered sensation in the S1 dermatome. Some discomfort with extension and flexion of the lumbar spine.  Lumbar spine exam reveals no evidence of soft tissue swelling, ecchymosis or deformity. The abdomen is soft and nontender. Nontender over the trochanters. No cellulitis or lymphadenopathy.  Motor is 5/5 including tibialis anterior, plantar flexion, quadriceps and hamstrings. Patient is normoreflexic. There is no Babinski or clonus. The patient has good distal pulses. No DVT. No pain and normal range of motion without instability of the hips, knees and ankles.  Neurological:  She is alert and oriented to person, place, and time. She has normal reflexes.  Skin: Skin is warm and dry.  Psychiatric: She has a normal mood and affect.     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  X-rays demonstrate disc degeneration at 5-1.  There are no modic changes at 5-1.  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  I had an extensive discussion with Vendetta concerning current pathology, relevant anatomy and treatment options. She reports she remains disabled by her symptomatology. Prior to this she had occasional back pain but nothing disabling. She now reports significant weakness and numbness in the lower extremities. Her main rupture is to the right at this point, consistent with her symptoms. Given the neural compressive lesion and the large disc herniation 6 weeks in duration despite Prednisone which gave temporary relief, activity modifications, she is unable to straighten the leg out or perform any type of activity.  We discussed lumbar decompression.  Risks and benefits of this procedure were discussed with the patient including worsening of symptoms, no changes in symptoms, recurrent disc herniation, scar tissue, epidural fibrosis, damage to neurovascular structures, cerebral spinal fluid leak which would require repair or patching, DVT, PE, anesthetic complications, etc. We discussed the perioperative course, the hospitalization, and the need for postoperative rehabilitation and the time estimate for recovery. We also discussed the possibility of future surgery including repeat decompression, fusion. The patient was provided an illustrated handout which was discussed in detail. Appropriate anatomic models were used as well.  I offered to take her out of work prior to that for 2-6 weeks postoperatively. We discussed the possibility of recurrent disc herniation of 15%, the probability of residual back pain requiring conservative treatment and the possibility of recurrent disc herniation  and progressive disc space collapse requiring fusion in the future. We mutually agreed fusion is not indicated at this point in time. I will proceed accordingly. With any changes in the interim, she's to call. I refilled her pain medicine, her Oxycodone. Refills by request.  Plan microlumbar decompression L5-S1  Dayna Barker. Bissell PA-C for Dr. Shelle Iron 08/17/2013, 9:49 AM

## 2013-08-26 NOTE — Evaluation (Signed)
Physical Therapy Evaluation Patient Details Name: [name] MRN: [id number] DOB: [birth year] Today's Date: 08/26/2013   History of Present Illness     Clinical Impression  Pt s/p bil L5-S1 microlumbar decompression presents with functional mobility limitations 2* post op back precautions and pain.  Pt should progress to d/c home with family.    Follow Up Recommendations No PT follow up    Equipment Recommendations  Rolling walker with 5" wheels    Recommendations for Other Services OT consult     Precautions / Restrictions Precautions Precautions: Back Restrictions Weight Bearing Restrictions: No      Mobility  Bed Mobility Overal bed mobility: Needs Assistance Bed Mobility: Supine to Sit;Sit to Supine     Supine to sit: Min assist;Mod assist Sit to supine: Min assist;Mod assist   General bed mobility comments: cues for correct log roll technique  Transfers Overall transfer level: Needs assistance Equipment used: Rolling walker (2 wheeled) Transfers: Sit to/from Stand Sit to Stand: Mod assist         General transfer comment: cues for transition position, use of UEs to self assist and avoidance of fwd flex  Ambulation/Gait Ambulation/Gait assistance: Min assist Ambulation Distance (Feet): 123 Feet Assistive device: Rolling walker (2 wheeled) Gait Pattern/deviations: Step-to pattern;Step-through pattern;Decreased step length - right;Decreased step length - left;Shuffle Gait velocity: decr   General Gait Details: cues for position from RW, stride length and posture.  Ltd by onset dizziness  Information systems manager [employer] (Stroke Patients Only)       Balance                                             Pertinent Vitals/Pain 4/10; premed    Home Living Family/patient expects to be discharged to:: Private residence Living Arrangements: Spouse/significant other Available Help at Discharge:  Family Type of Home: House Home Access: Stairs to enter Entrance Stairs-Rails: Right Entrance Stairs-Number of Steps: 4 Home Layout: Two level Home Equipment: None      Prior Function Level of Independence: Needs assistance         Comments: pt states has been limited in all activities by pain     Hand Dominance   Dominant Hand: Right    Extremity/Trunk Assessment   Upper Extremity Assessment: Overall WFL for tasks assessed           Lower Extremity Assessment: Overall WFL for tasks assessed         Communication   Communication: No difficulties  Cognition Arousal/Alertness: Awake/alert Behavior During Therapy: WFL for tasks assessed/performed Overall Cognitive Status: Within Functional Limits for tasks assessed                      General Comments      Exercises        Assessment/Plan    PT Assessment Patient needs continued PT services  PT Diagnosis Difficulty walking   PT Problem List Decreased strength;Decreased range of motion;Decreased activity tolerance;Decreased mobility;Decreased knowledge of use of DME;Pain;Decreased knowledge of precautions  PT Treatment Interventions DME instruction;Gait training;Stair training;Functional mobility training;Therapeutic activities;Therapeutic exercise;Patient/family education   PT Goals (Current goals can be found in the Care Plan section) Acute Rehab PT Goals Patient Stated Goal: walk without leg pain PT Goal Formulation: With patient Time  For Goal Achievement: 09/02/13 Potential to Achieve Goals: Good    Frequency 7X/week   Barriers to discharge        Co-evaluation               End of Session   Activity Tolerance: Patient tolerated treatment well;Other (comment) (c/o dizziness during ambulation) Patient left: in bed;with call bell/phone within reach;with family/visitor present Nurse Communication: Mobility status    Functional Assessment Tool Used: clinical  judgement Functional Limitation: Mobility: Walking and moving around Mobility: Walking and Moving Around Current Status (G8115): At least 20 percent but less than 40 percent impaired, limited or restricted Mobility: Walking and Moving Around Goal Status [phone]): At least 1 percent but less than 20 percent impaired, limited or restricted    Time: 1640-1710 PT Time Calculation (min): 30 min   Charges:   PT Evaluation $Initial PT Evaluation Tier I: 1 Procedure PT Treatments $Gait Training: 8-22 mins $Therapeutic Activity: 8-22 mins   PT G Codes:   Functional Assessment Tool Used: clinical judgement Functional Limitation: Mobility: Walking and moving around    [facility] ([place] 08/26/2013, 6:29 PM

## 2013-08-26 NOTE — Interval H&P Note (Signed)
History and Physical Interval Note:  08/26/2013 8:17 AM  [name]  has presented today for surgery, with the diagnosis of HP L4-S1  The various methods of treatment have been discussed with the patient and family. After consideration of risks, benefits and other options for treatment, the patient has consented to  Procedure(s): MICRO LUMBER DECOMPRESSION L5-S1 (N/A) as a surgical intervention .  The patient's history has been reviewed, patient examined, no change in status, stable for surgery.  I have reviewed the patient's chart and labs.  Questions were answered to the patient's satisfaction.     BEANE,JEFFREY C

## 2013-08-26 NOTE — Brief Op Note (Signed)
08/26/2013  10:27 AM  PATIENT:  [name]  53 y.o. female  PRE-OPERATIVE DIAGNOSIS:  HP L4-S1  POST-OPERATIVE DIAGNOSIS:  HP L4-S1  PROCEDURE:  Procedure(s): MICRO LUMBER DECOMPRESSION L5-S1 BILATERAL (N/A)  SURGEON:  Surgeon(s) and Role:    * Javier Docker, MD - Primary  PHYSICIAN ASSISTANT:   ASSISTANTS: Bissell   ANESTHESIA:   general  EBL:  Total I/O In: 1000 [I.V.:1000] Out: 100 [Blood:100]  BLOOD ADMINISTERED:none  DRAINS: none   LOCAL MEDICATIONS USED:  MARCAINE     SPECIMEN:  Source of Specimen:  L5S1  DISPOSITION OF SPECIMEN:  PATHOLOGY  COUNTS:  YES  TOURNIQUET:  * No tourniquets in log *  DICTATION: .Other Dictation: Dictation Number  [id number]  PLAN OF CARE: Admit for overnight observation  PATIENT DISPOSITION:  PACU - hemodynamically stable.   Delay start of Pharmacological VTE agent (>24hrs) due to surgical blood loss or risk of bleeding: yes

## 2013-08-26 NOTE — Op Note (Signed)
[name], [name]               ACCOUNT NO.:  [id number]  MEDICAL RECORD NO.:  [id number]  LOCATION:  [facility]                         FACILITY:  [facility]  PHYSICIAN:  Jene Every, M.D.    DATE OF BIRTH:  1961-01-20  DATE OF PROCEDURE:  08/26/2013 DATE OF DISCHARGE:                              OPERATIVE REPORT   PREOPERATIVE DIAGNOSES: 1. Spinal stenosis, HNP L5-S1 bilaterally, both. 2. Elevated BMI with a level of 30.6.  POSTOPERATIVE DIAGNOSES: 1. Spinal stenosis, HNP L5-S1 bilaterally, both. 2. Elevated BMI with a level of 30.6.  PROCEDURE PERFORMED: 1. Bilateral micro hemilaminotomy at L5-S1. 2. Bilateral foraminotomies of L5 and S1. 3. Bilateral excision of free fragment disk herniation L5-S1.  ANESTHESIA:  General.  ASSISTANT:  Lanna Poche, PA-C; was used for intermittent neural traction, suction, the patient positioning, and closing.  Technical difficulty increased due to the patient's elevated BMI, it was 30.6.  BRIEF HISTORY:  This is a pleasant 53 year old with disk herniations and extruded L5-S1 to the right, displaced in the S1 nerve root.  She was indicated for lumbar decompression, bilateral symptomatology, failing conservative treatment.  Risks and benefits were discussed including bleeding, infection, damage to neurovascular structures, DVT, PE, anesthetic complications, need for fusion in the future, etc.  She had mild terminal weakness, dermatomal dysesthesias L5-S1 nerve root distribution particularly on the right.  Minimal back pain.  TECHNIQUE:  With the patient in supine position, after the induction of adequate general anesthesia, 2 g Kefzol and clindamycin, she was placed prone on the Grover frame.  All bony prominences were well padded. Lumbar region was prepped and draped usual sterile fashion.  Foley to gravity.  Two 18-gauge spinal needle was utilized to localize L5-S1 interspace, confirmed with x-ray.  Incision was made from the  spinous process of L5-S1.  Subcutaneous tissue was dissected.  Electrocautery was utilized to achieve hemostasis in a fairly deep subcutaneous adipose layer.  We divided the dorsal lumbar fascia on either side of the interspinous ligament and elevated the paraspinous musculature at L5-S1. Deep McCullough retractors were placed.  Confirmatory radiograph obtained.  Attention was turned towards the left first using the operating microscope.  We used curette to detach the ligamentum flavum from the cephalad edge of S1.  We carefully removed the ligamentum flavum from the interspace.  The nerve root was found to be compressing the lateral recess.  There was a disk fragment in the axilla of the root.  We retrieved the fragment with a micropituitary.  I performed a foraminotomy of S1 generous and gently mobilized the nerve root medially.  Decompressed lateral recess to the medial border of the pedicle.  Performed a foraminotomy of L5 superior articulating process and extensive hypertrophic venous plexus on the left.  We cauterized bipolar stasis thrombin-soaked Gelfoam.  We looked at the disk space on this side and tracked up to the disk.  There was no residual herniation on the left and the nerve root was free, it was erythematous and edematous.  We copiously irrigated the site.  We turned our attention to the right in a similar fashion and opened the interlaminar window. Again, S1 nerve root was compressing the lateral recess.  A fragment was noted in the axilla of the root.  This was gently mobilized with a micro nerve hook and a micropituitary removing two large fragments extending back.  Following this, we were gently able to mobilize the S1 nerve root medially.  She had significant hypertrophic facet as noted on the left. We decompressed lateral recess to the medial border of the pedicle.  We performed a generous foraminotomy of S1 and into the foramen of L5 in the superior articulating  process.  We then used a __________ checked the disk down the aperture from which it was herniated from the disk. No residual disk material was noted.  I had 1 cm of excursion in the S1 nerve root __________ pedicle without tension on both sides.  An Woodson retractor probe passed freely at both foramen.  We copiously irrigated the wound.  No evidence of CSF leakage or active bleeding.  We draped epidural fat and thrombin-soaked Gelfoam on the right.  Turned our attention back to the left.  No residual disk herniation noted.  Again, bipolar electrocautery was required to achieve hemostasis as was thrombin-soaked Gelfoam.  I checked beneath thecal sac from both sides, the shoulder, and the axilla of the roots as well as both foramen no residual disk herniation.  We copiously irrigated the wound, removed the Tolleson, and we irrigated the paraspinous musculature.  Closed the fascia on both side with 1 Vicryl interrupted figure-of-eight sutures, subcu with multiple layers of 2-0 and skin with subcuticular Prolene. Sterile dressing was applied.  Placed supine on hospital bed, extubated without difficulty, and transported to the recovery room in satisfactory condition.  The patient tolerated the procedure well.  No complications.  Minimal blood loss.  Again, __________ increased by the patient's elevated BMI, which required extended retractors and multiple layered closure.     Jene Every, M.D.     Cordelia Pen  D:  08/26/2013  T:  08/26/2013  Job:  [id number]

## 2013-08-26 NOTE — Transfer of Care (Signed)
Immediate Anesthesia Transfer of Care Note  Patient: [name]  Procedure(s) Performed: Procedure(s) (LRB): MICRO LUMBER DECOMPRESSION L5-S1 BILATERAL (N/A)  Patient Location: PACU  Anesthesia Type: General  Level of Consciousness: sedated, patient cooperative and responds to stimulation  Airway & Oxygen Therapy: Patient Spontanous Breathing and Patient connected to face mask oxgen  Post-op Assessment: Report given to PACU RN and Post -op Vital signs reviewed and stable  Post vital signs: Reviewed and stable  Complications: No apparent anesthesia complications

## 2013-08-27 ENCOUNTER — Encounter (HOSPITAL_COMMUNITY): Payer: Self-pay | Admitting: Specialist

## 2013-08-27 LAB — BASIC METABOLIC PANEL
BUN: 7 mg/dL (ref 6–23)
CALCIUM: 8 mg/dL — AB (ref 8.4–10.5)
CO2: 26 mEq/L (ref 19–32)
CREATININE: 0.55 mg/dL (ref 0.50–1.10)
Chloride: 104 mEq/L (ref 96–112)
GFR calc Af Amer: >90 mL/min (ref >90)
GFR calc non Af Amer: >90 mL/min (ref >90)
GLUCOSE: 141 mg/dL — AB (ref 70–99)
Potassium: 4.5 mEq/L (ref 3.7–5.3)
Sodium: 140 mEq/L (ref 137–147)

## 2013-08-27 MED ORDER — OXYCODONE HCL 5 MG PO TABS: 5.0000 mg | ORAL_TABLET | ORAL | Status: DC | PRN

## 2013-08-27 NOTE — Progress Notes (Signed)
[facility]   [facility] is providing the following services: RW and Commode  If patient discharges after hours, please call [phone].   Renard Hamper 08/27/2013, 10:44 AM

## 2013-08-27 NOTE — Progress Notes (Signed)
Physical Therapy Treatment Patient Details Name: [name] MRN: [id number] DOB: [birth year] Today's Date: 09/09/13    History of Present Illness      PT Comments    Marked improvement in activity tolerance and stability with OOB activity.  Follow Up Recommendations  No PT follow up     Equipment Recommendations  Rolling walker with 5" wheels    Recommendations for Other Services OT consult     Precautions / Restrictions Precautions Precautions: Back Precaution Comments: all precautions reviewed Restrictions Weight Bearing Restrictions: No    Mobility  Bed Mobility Overal bed mobility: Needs Assistance Bed Mobility: Supine to Sit     Supine to sit: Min guard     General bed mobility comments: cues for correct log roll technique  Transfers Overall transfer level: Needs assistance Equipment used: Rolling walker (2 wheeled) Transfers: Sit to/from Stand Sit to Stand: Min assist         General transfer comment: cues for transition position, use of UEs to self assist and avoidance of fwd flex  Ambulation/Gait Ambulation/Gait assistance: Min guard Ambulation Distance (Feet): 240 Feet Assistive device: Rolling walker (2 wheeled) Gait Pattern/deviations: Step-through pattern;Decreased step length - right;Decreased step length - left;Shuffle Gait velocity: decr   General Gait Details: cues for position from RW, stride length and posture.    Stairs Stairs: Yes Stairs assistance: Min assist Stair Management: One rail Left;Step to pattern;Forwards Number of Stairs: 2 General stair comments: cues for sequence and foot placement; rail on one side, HHA on opposite  Wheelchair Mobility    Modified Rankin (Stroke Patients Only)       Balance                                    Cognition Arousal/Alertness: Awake/alert Behavior During Therapy: WFL for tasks assessed/performed Overall Cognitive Status: Within Functional Limits for tasks  assessed                      Exercises      General Comments        Pertinent Vitals/Pain 3-4/10; premed    Home Living                      Prior Function            PT Goals (current goals can now be found in the care plan section) Acute Rehab PT Goals Patient Stated Goal: walk without leg pain PT Goal Formulation: With patient Time For Goal Achievement: 09/02/13 Potential to Achieve Goals: Good Progress towards PT goals: Progressing toward goals    Frequency  7X/week    PT Plan Current plan remains appropriate    Co-evaluation             End of Session   Activity Tolerance: Patient tolerated treatment well Patient left: in chair;with call bell/phone within reach;with family/visitor present     Time: 7672-0947 PT Time Calculation (min): 38 min  Charges:  $Gait Training: 23-37 mins $Therapeutic Activity: 8-22 mins                    G Codes:      BRADSHAW,HUNTER 09/09/2013, 10:35 AM

## 2013-08-27 NOTE — Evaluation (Addendum)
Occupational Therapy Evaluation Patient Details Name: [name] MRN: [id number] DOB: September 20, 1960 Today's Date: 08/27/2013    History of Present Illness Pt is s/p MICRO LUMBER DECOMPRESSION L5-S1 BILATERAL    Clinical Impression   Pt educated on all back precautions and reviewed handout for back care. Introduced AE including toilet aid. Pt will benefit from additional practice if here after today but ok to d/c home with spouse later today from OT standpoint if pt feels ready. They verbalize understanding of all information from OT session today.    Follow Up Recommendations  No OT follow up;Supervision/Assistance - 24 hour    Equipment Recommendations  3 in 1 bedside comode (pt states she will likely obtain a tubseat on her own)    Recommendations for Other Services       Precautions / Restrictions Precautions Precautions: Back Precaution Comments: Issued back care handout and reviewed all precautions with pt. She could only state 1/3 on her own.  Restrictions Weight Bearing Restrictions: No      Mobility Bed Mobility Overal bed mobility: Needs Assistance Bed Mobility: Supine to Sit     Supine to sit: Min guard     General bed mobility comments: cues for correct log roll technique  Transfers Overall transfer level: Needs assistance Equipment used: Rolling walker (2 wheeled) Transfers: Sit to/from Stand Sit to Stand: Min guard         General transfer comment: verbal cues for technique to adhere to back precautions and hand placement     Balance                                            ADL Overall ADL's : Needs assistance/impaired Eating/Feeding: Independent;Sitting   Grooming: Wash/dry hands;Set up;Sitting   Upper Body Bathing: Supervision/ safety;Set up;Sitting   Lower Body Bathing: Maximal assistance;Sit to/from stand Lower Body Bathing Details (indicate cue type and reason): without AE Upper Body Dressing : Set up;Sitting    Lower Body Dressing: Maximal assistance;Sit to/from stand   Toilet Transfer: RW;Ambulation;BSC;Min guard   Toileting- Water quality scientist and Hygiene: Moderate assistance;Sit to/from stand Toileting - Clothing Manipulation Details (indicate cue type and reason): unable to reach periareas without twisting.       General ADL Comments: Educated pt on all back precautions as well as spouse and issued back care handout. Pt not able to reach periareas without breaking precautions so explained toilet aid option and coverage and pt plans to have husband obtain. Pt is familiar with AE and husband had a kit but they dont have it anymore so plan to also purchase AE kit. Practiced with sock aid and reacher to don and doff sock today. Demonstrated reacher for pants/underwear and sponge, shoe horn. Pt is leaning toward purchasing a tubseat on her own. Demonstrated how to transfer into tub and pt verbalizes understanding and declined need to practice. She states husband will be there to help in and out and verbalizes understanding of technique. Husband agreed.  Reviewed technique for brushing technique and donning shirt also.     Vision                     Perception     Praxis      Pertinent Vitals/Pain 4/10 back pain; reposition, rest     Hand Dominance Right   Extremity/Trunk Assessment Upper Extremity Assessment Upper Extremity Assessment: Overall  WFL for tasks assessed           Communication Communication Communication: No difficulties   Cognition Arousal/Alertness: Awake/alert Behavior During Therapy: WFL for tasks assessed/performed Overall Cognitive Status: Within Functional Limits for tasks assessed                     General Comments       Exercises       Shoulder Instructions      Home Living Family/patient expects to be discharged to:: Private residence Living Arrangements: Spouse/significant other Available Help at Discharge: Family Type of  Home: House Home Access: Stairs to enter Technical brewer of Steps: 4 Entrance Stairs-Rails: Right Home Layout: Two level Alternate Level Stairs-Number of Steps: 2 Alternate Level Stairs-Rails: None Bathroom Shower/Tub: Teacher, early years/pre: Standard     Home Equipment: None          Prior Functioning/Environment Level of Independence: Needs assistance        Comments: pt states has been limited in all activities by pain    OT Diagnosis: Generalized weakness   OT Problem List: Decreased strength;Decreased knowledge of use of DME or AE;Decreased knowledge of precautions   OT Treatment/Interventions: Self-care/ADL training;Patient/family education;Therapeutic activities;DME and/or AE instruction    OT Goals(Current goals can be found in the care plan section) Acute Rehab OT Goals Patient Stated Goal: decrease pain OT Goal Formulation: With patient/family Time For Goal Achievement: 09/03/13 Potential to Achieve Goals: Good  OT Frequency: Min 2X/week   Barriers to D/C:            Co-evaluation              End of Session Equipment Utilized During Treatment: Rolling walker  Activity Tolerance: Patient tolerated treatment well Patient left: in chair;with call bell/phone within reach   Time: 3825-0539 OT Time Calculation (min): 43 min Charges:  OT General Charges $OT Visit: 1 Procedure OT Evaluation $Initial OT Evaluation Tier I: 1 Procedure OT Treatments $Self Care/Home Management : 23-37 mins $Therapeutic Activity: 8-22 mins G-Codes: OT G-codes **NOT FOR INPATIENT CLASS** Functional Assessment Tool Used: clinical judgement Functional Limitation: Self care Self Care Current Status (J6734): At least 40 percent but less than 60 percent impaired, limited or restricted Self Care Goal Status (L9379): At least 1 percent but less than 20 percent impaired, limited or restricted  Jules Schick [phone] 08/27/2013, 11:01 AM

## 2013-08-27 NOTE — Progress Notes (Signed)
Subjective: 1 Day Post-Op Procedure(s) (LRB): MICRO LUMBER DECOMPRESSION L5-S1 BILATERAL (N/A) Patient reports pain as 3 on 0-10 scale.    Objective: Vital signs in last 24 hours: Temp:  [97.5 F (36.4 C)-98 F (36.7 C)] 97.8 F (36.6 C) (06/18 0620) Pulse Rate:  [71-98] 71 (06/18 0620) Resp:  [7-19] 16 (06/18 0620) BP: (96-121)/(53-74) 121/71 mmHg (06/18 0620) SpO2:  [94 %-100 %] 98 % (06/18 0620) Weight:  [83.462 kg (184 lb)] 83.462 kg (184 lb) (06/17 1251)  Intake/Output from previous day: 06/17 0701 - 06/18 0700 In: 3685 [P.O.:820; I.V.:2810; IV Piggyback:55] Out: 3575 [Urine:3475; Blood:100] Intake/Output this shift:    No results found for this basename: HGB,  in the last 72 hours No results found for this basename: WBC, RBC, HCT, PLT,  in the last 72 hours  Recent Labs  08/27/13 0546  NA 140  K 4.5  CL 104  CO2 26  BUN 7  CREATININE 0.55  GLUCOSE 141*  CALCIUM 8.0*   No results found for this basename: LABPT, INR,  in the last 72 hours  Neurologically intact Sensation intact distally Intact pulses distally Compartment soft  Assessment/Plan: 1 Day Post-Op Procedure(s) (LRB): MICRO LUMBER DECOMPRESSION L5-S1 BILATERAL (N/A) Advance diet Up with therapy D/C IV fluids Possible D/C. OP discussed and Postop. F/U 2 weeks.  BEANE,JEFFREY C 08/27/2013, 7:41 AM

## 2013-08-27 NOTE — Discharge Summary (Signed)
Physician Discharge Summary   Patient ID: [name] MRN: [id number] DOB/AGE: Nov 11, 1960 53 y.o.  Admit date: 08/26/2013 Discharge date: 08/27/2013  Primary Diagnosis:   HP L4-S1  Admission Diagnoses:  Past Medical History  Diagnosis Date  . Ulcer   . Hernia   . Arthritis   . DDD (degenerative disc disease)   . Depression with anxiety     r/t death of mother  . Shortness of breath     JUST WHEN I MOVE - RELATED TO MY BACK PAIN  . Headache(784.0)     MIGRAINES   Discharge Diagnoses:   Active Problems:   HNP (herniated nucleus pulposus)  Procedure:  Procedure(s) (LRB): MICRO LUMBER DECOMPRESSION L5-S1 BILATERAL (N/A)   Consults: None  HPI:  see H&P    Laboratory Data: Hospital Outpatient Visit on 08/18/2013  Component Date Value Ref Range Status  . MRSA, PCR 08/18/2013 NEGATIVE  NEGATIVE Final  . Staphylococcus aureus 08/18/2013 POSITIVE* NEGATIVE Final   Comment:                                 The Xpert SA Assay (FDA                          approved for NASAL specimens                          in patients over 55 years of age),                          is one component of                          a comprehensive surveillance                          program.  Test performance has                          been validated by [employer] for patients greater                          than or equal to 32 year old.                          It is not intended                          to diagnose infection nor to                          guide or monitor treatment.  . Sodium 08/18/2013 139  137 - 147 mEq/L Final  . Potassium 08/18/2013 4.5  3.7 - 5.3 mEq/L Final  . Chloride 08/18/2013 101  96 - 112 mEq/L Final  . CO2 08/18/2013 25  19 - 32 mEq/L Final  . Glucose, Bld 08/18/2013 90  70 - 99 mg/dL Final  . BUN 08/18/2013 16  6 - 23 mg/dL Final  .  Creatinine, Ser 08/18/2013 0.64  0.50 - 1.10 mg/dL Final  . Calcium 08/18/2013 9.3  8.4 -  10.5 mg/dL Final  . GFR calc non Af Amer 08/18/2013 >90  >90 mL/min Final  . GFR calc Af Amer 08/18/2013 >90  >90 mL/min Final   Comment: (NOTE)                          The eGFR has been calculated using the CKD EPI equation.                          This calculation has not been validated in all clinical situations.                          eGFR's persistently <90 mL/min signify possible Chronic Kidney                          Disease.  . WBC 08/18/2013 8.7  4.0 - 10.5 K/uL Final  . RBC 08/18/2013 4.43  3.87 - 5.11 MIL/uL Final  . Hemoglobin 08/18/2013 13.5  12.0 - 15.0 g/dL Final  . HCT 08/18/2013 40.9  36.0 - 46.0 % Final  . MCV 08/18/2013 92.3  78.0 - 100.0 fL Final  . MCH 08/18/2013 30.5  26.0 - 34.0 pg Final  . MCHC 08/18/2013 33.0  30.0 - 36.0 g/dL Final  . RDW 08/18/2013 12.2  11.5 - 15.5 % Final  . Platelets 08/18/2013 243  150 - 400 K/uL Final   No results found for this basename: HGB,  in the last 72 hours No results found for this basename: WBC, RBC, HCT, PLT,  in the last 72 hours  Recent Labs  08/27/13 0546  NA 140  K 4.5  CL 104  CO2 26  BUN 7  CREATININE 0.55  GLUCOSE 141*  CALCIUM 8.0*   No results found for this basename: LABPT, INR,  in the last 72 hours  X-Rays:Dg Lumbar Spine 2-3 Views  08/18/2013   CLINICAL DATA:  Lumbar spine degenerative disease.  EXAM: LUMBAR SPINE - 2-3 VIEW  COMPARISON:  Lumbar spine series 5 11/2013.  FINDINGS: Paraspinal soft tissues unremarkable. Diffuse multilevel disc degeneration with degenerative endplate osteophyte formation noted. No acute bony abnormality. Normal bony alignment and mineralization.  IMPRESSION: Diffuse degenerative changes lumbar spine, no acute abnormality. Normal alignment and mineralization.   Electronically Signed   By: Marcello Moores  Register   On: 08/18/2013 14:10   Dg Spine Portable 1 View  08/26/2013   CLINICAL DATA:  Spine surgery.  EXAM: PORTABLE SPINE - 1 VIEW  COMPARISON:  08/26/2013.  FINDINGS: Metallic  probe noted at the L5-S1 disc space. Degenerative changes lumbar spine. No acute bony abnormality.  IMPRESSION: Metallic probe noted at the L5-S1 disc space.   Electronically Signed   By: Marcello Moores  Register   On: 08/26/2013 09:55   Dg Spine Portable 1 View  08/26/2013   CLINICAL DATA:  Lumbar surgery.  EXAM: PORTABLE SPINE - 1 VIEW  COMPARISON:  08/26/2013.  FINDINGS: Surgical probes are noted along the posterior aspect of the upper sacrum just below the L5-S1 disc space.  IMPRESSION: Surgical probes are noted along the upper sacrum posteriorly. These are just below the L5-S1 disc space.   Electronically Signed   By: Marcello Moores  Register   On: 08/26/2013 09:16   Dg Spine Portable 1  View  08/26/2013   CLINICAL DATA:  Surgical level L5-S1  EXAM: PORTABLE SPINE - 1 VIEW  COMPARISON:  Lumbar spine radiograph 08/18/2013  FINDINGS: A single lateral projection of the lumbar spine is provided. There are 2 sharp tip probes. The inferior probe is at the L5-S1 discs space and the superior probes the L4-L5 disc space level.  IMPRESSION: Intraoperative lumbar spine.   Electronically Signed   By: Suzy Bouchard M.D.   On: 08/26/2013 09:08    EKG: Orders placed during the hospital encounter of 10/12/12  . EKG 12-LEAD  . EKG 12-LEAD  . EKG 12-LEAD  . EKG 12-LEAD  . EKG 12-LEAD  . EKG 12-LEAD  . EKG     Hospital Course: Patient was admitted to [facility] and taken to the OR and underwent the above state procedure without complications.  Patient tolerated the procedure well and was later transferred to the recovery room and then to the orthopaedic floor for postoperative care.  They were given PO and IV analgesics for pain control following their surgery.  They were given 24 hours of postoperative antibiotics.   PT was consulted postop to assist with mobility and transfers.  The patient was allowed to be WBAT with therapy and was taught back precautions. Discharge planning was consulted to help with postop  disposition and equipment needs.  Patient had a good night on the evening of surgery and started to get up OOB with therapy on day one. Patient was seen in rounds and was ready to go home on day one.  They were given discharge instructions and dressing directions.  They were instructed on when to follow up in the office with Dr. Tonita Cong.   Diet: Regular diet Activity:WBAT; Lspine precautions Follow-up:in 10-14 days Disposition - Home Discharged Condition: good   Discharge Instructions   Call MD / Call 911    Complete by:  As directed   If you experience chest pain or shortness of breath, CALL 911 and be transported to the hospital emergency room.  If you develope a fever above 101 F, pus (white drainage) or increased drainage or redness at the wound, or calf pain, call your surgeon's office.     Constipation Prevention    Complete by:  As directed   Drink plenty of fluids.  Prune juice may be helpful.  You may use a stool softener, such as Colace (over the counter) 100 mg twice a day.  Use MiraLax (over the counter) for constipation as needed.     Diet - low sodium heart healthy    Complete by:  As directed      Increase activity slowly as tolerated    Complete by:  As directed             Medication List    STOP taking these medications       cyclobenzaprine 5 MG tablet  Commonly known as:  FLEXERIL      TAKE these medications       alprazolam 2 MG tablet  Commonly known as:  XANAX  Take 2 mg by mouth at bedtime as needed for sleep.     buPROPion 150 MG 24 hr tablet  Commonly known as:  WELLBUTRIN XL  Take 150 mg by mouth every morning.     docusate sodium 100 MG capsule  Commonly known as:  COLACE  Take 1 capsule (100 mg total) by mouth 2 (two) times daily as needed for mild constipation.  escitalopram 10 MG tablet  Commonly known as:  LEXAPRO  Take 10 mg by mouth at bedtime.     estradiol 0.5 MG tablet  Commonly known as:  ESTRACE  Take 0.5 mg by mouth 2 (two)  times daily.     methocarbamol 500 MG tablet  Commonly known as:  ROBAXIN  Take 1 tablet (500 mg total) by mouth 3 (three) times daily between meals as needed for muscle spasms.     mupirocin ointment 2 %  Commonly known as:  BACTROBAN  Apply 1 application topically 2 (two) times daily.     oxyCODONE 5 MG immediate release tablet  Commonly known as:  Oxy IR/ROXICODONE  Take 1-2 tablets (5-10 mg total) by mouth every 4 (four) hours as needed for severe pain.     Vitamin D (Ergocalciferol) 50000 UNITS Caps capsule  Commonly known as:  DRISDOL  Take 50,000 Units by mouth every 7 (seven) days. Takes on Thursdays     zolpidem 10 MG tablet  Commonly known as:  AMBIEN  Take 10 mg by mouth at bedtime as needed for sleep.           Follow-up Information   Follow up with BEANE,JEFFREY C, MD In 2 weeks.   Specialty:  Orthopedic Surgery   Contact information:   [address] [place] [address] [phone]       Signed: Lacie Draft, PA-C Orthopaedic Surgery 08/27/2013, 12:56 PM

## 2013-08-27 NOTE — Progress Notes (Signed)
RN reviewed discharge instructions with patient and family. All questions answered.   Paperwork and prescriptions given.   RN rolled patient down to family car.

## 2013-09-23 ENCOUNTER — Other Ambulatory Visit: Payer: Self-pay | Admitting: Family Medicine

## 2013-09-23 ENCOUNTER — Ambulatory Visit
Admission: RE | Admit: 2013-09-23 | Discharge: 2013-09-23 | Disposition: A | Discharge status: Home / Self Care | Payer: BC Managed Care – PPO | Source: Ambulatory Visit | Attending: Family Medicine | Admitting: Family Medicine

## 2013-09-23 DIAGNOSIS — R079 Chest pain, unspecified: Secondary | ICD-10-CM

## 2013-09-23 MED ORDER — IOHEXOL 350 MG/ML SOLN
125.0000 mL | Freq: Once | INTRAVENOUS | Status: AC | PRN
  Administered 2013-09-23: 125 mL via INTRAVENOUS

## 2013-10-12 ENCOUNTER — Encounter (HOSPITAL_COMMUNITY): Payer: Self-pay | Admitting: Pharmacy Technician

## 2013-10-13 ENCOUNTER — Encounter (HOSPITAL_COMMUNITY): Payer: Self-pay

## 2013-10-13 ENCOUNTER — Encounter (HOSPITAL_COMMUNITY)
Admission: RE | Admit: 2013-10-13 | Discharge: 2013-10-13 | Disposition: A | Discharge status: Home / Self Care | Payer: BC Managed Care – PPO | Source: Ambulatory Visit | Attending: Orthopedic Surgery | Admitting: Orthopedic Surgery

## 2013-10-13 DIAGNOSIS — Z885 Allergy status to narcotic agent status: Secondary | ICD-10-CM | POA: Diagnosis not present

## 2013-10-13 DIAGNOSIS — Z79899 Other long term (current) drug therapy: Secondary | ICD-10-CM | POA: Diagnosis not present

## 2013-10-13 DIAGNOSIS — Z9889 Other specified postprocedural states: Secondary | ICD-10-CM | POA: Diagnosis not present

## 2013-10-13 DIAGNOSIS — Z85828 Personal history of other malignant neoplasm of skin: Secondary | ICD-10-CM | POA: Diagnosis not present

## 2013-10-13 DIAGNOSIS — M069 Rheumatoid arthritis, unspecified: Secondary | ICD-10-CM | POA: Diagnosis not present

## 2013-10-13 DIAGNOSIS — Z90710 Acquired absence of both cervix and uterus: Secondary | ICD-10-CM | POA: Diagnosis not present

## 2013-10-13 DIAGNOSIS — F411 Generalized anxiety disorder: Secondary | ICD-10-CM | POA: Diagnosis not present

## 2013-10-13 DIAGNOSIS — M5126 Other intervertebral disc displacement, lumbar region: Secondary | ICD-10-CM | POA: Diagnosis not present

## 2013-10-13 DIAGNOSIS — K219 Gastro-esophageal reflux disease without esophagitis: Secondary | ICD-10-CM | POA: Diagnosis not present

## 2013-10-13 DIAGNOSIS — M549 Dorsalgia, unspecified: Secondary | ICD-10-CM | POA: Diagnosis present

## 2013-10-13 DIAGNOSIS — Z87891 Personal history of nicotine dependence: Secondary | ICD-10-CM | POA: Diagnosis not present

## 2013-10-13 DIAGNOSIS — Z9079 Acquired absence of other genital organ(s): Secondary | ICD-10-CM | POA: Diagnosis not present

## 2013-10-13 HISTORY — DX: Depression, unspecified: F32.A

## 2013-10-13 HISTORY — DX: Frequency of micturition: R35.0

## 2013-10-13 HISTORY — DX: Dorsalgia, unspecified: M54.9

## 2013-10-13 HISTORY — DX: Other muscle spasm: M62.838

## 2013-10-13 HISTORY — DX: Major depressive disorder, single episode, unspecified: F32.9

## 2013-10-13 HISTORY — DX: Anxiety disorder, unspecified: F41.9

## 2013-10-13 HISTORY — DX: Nocturia: R35.1

## 2013-10-13 HISTORY — DX: Vitamin D deficiency, unspecified: E55.9

## 2013-10-13 HISTORY — DX: Weakness: R53.1

## 2013-10-13 HISTORY — DX: Gastro-esophageal reflux disease without esophagitis: K21.9

## 2013-10-13 HISTORY — DX: Dizziness and giddiness: R42

## 2013-10-13 HISTORY — DX: Constipation, unspecified: K59.00

## 2013-10-13 HISTORY — DX: Insomnia, unspecified: G47.00

## 2013-10-13 HISTORY — DX: Personal history of other diseases of the respiratory system: Z87.09

## 2013-10-13 LAB — CBC
HCT: 40.4 % (ref 36.0–46.0)
Hemoglobin: 13 g/dL (ref 12.0–15.0)
MCH: 29.9 pg (ref 26.0–34.0)
MCHC: 32.2 g/dL (ref 30.0–36.0)
MCV: 92.9 fL (ref 78.0–100.0)
Platelets: 266 10*3/uL (ref 150–400)
RBC: 4.35 MIL/uL (ref 3.87–5.11)
RDW: 13.1 % (ref 11.5–15.5)
WBC: 9.4 10*3/uL (ref 4.0–10.5)

## 2013-10-13 LAB — SURGICAL PCR SCREEN
MRSA, PCR: NEGATIVE
STAPHYLOCOCCUS AUREUS: NEGATIVE

## 2013-10-13 MED ORDER — DEXTROSE 5 % IV SOLN
2.0000 g | INTRAVENOUS | Status: AC
  Administered 2013-10-14: 2 g via INTRAVENOUS
  Filled 2013-10-13: qty 2

## 2013-10-13 MED ORDER — ACETAMINOPHEN 10 MG/ML IV SOLN: 1000.0000 mg | Freq: Four times a day (QID) | INTRAVENOUS | Status: DC

## 2013-10-13 NOTE — Progress Notes (Signed)
No orders at [name] requested them

## 2013-10-13 NOTE — Progress Notes (Addendum)
  Pt doesn't have a cardiologist  Denies ever having an echo/stress test/heart cath  EKG with Dr.McNeill to be requested     Medical Md is Dr.Wendy Corliss Blacker

## 2013-10-13 NOTE — H&P (Addendum)
History of Present Illness   The patient is a 53 year old female who presents with back pain. The patient is here today in referral from Dr Darrelyn Hillock. The patient reports low back symptoms which began 2 month(s) ago in association with a change in activity. The activity involved that occurred during recreational activities (twisted while exercising and fell). Symptoms are reported to be located in the left low back and in the right low back and Symptoms include pain (down right leg to the foot), numbness, burning and tingling, while symptoms do not include incontinence of stool or incontinence of urine. The pain radiates to the right buttock, right thigh and right lower leg. The patient describes the severity of their symptoms as severe. The patient feels as if the symptoms are worsening. Current treatment includes opioid analgesics (Hydromorphone). Prior to being seen today the patient was previously evaluated in this clinic (Dr Darrelyn Hillock). Past evaluation has included x-ray of the lumbar spine and MRI of the lumbar spine (@ [place]). Past treatment has included opioid analgesics  She presents today at the request of my partners, Dr. Shelle Iron and Dr. Darrelyn Hillock, for evaluation of recurrent horrific right leg pain.  The patient had a lumbar discectomy on 08/26/2013 and did very well. Two weeks after her surgery she went back to work and she was doing well, until about 3 weeks ago. There was no injury, trauma, or specific event, she just had recurrence of horrific right leg pain. She saw Dr. Melton Krebs who ordered a repeat MRI as well as injection therapy. The repeat MRIdone 10/06/2013 shows a large recurrent right disc herniation at L5-S1. No signs of infection. There is mild collapse of the disc. Plain x-rays taken 07/31/2013 show mild degenerative disease at the L5-S1 level. No scoliosis. No spondylolisthesis or spondylolysis.   Allergies  TraMADol HCl *ANALGESICS - OPIOID*  Family History  Cancer grandfather mothers  side Cerebrovascular Accident mother Chronic Obstructive Lung Disease father Diabetes Mellitus child Hypertension mother and father Osteoporosis mother and grandmother mothers side Rheumatoid Arthritis father Aneurysm Mother. First Degree Relatives  Social History  Tobacco use former smoker; smoke(d) less than 1/2 pack(s) per day Exercise Exercises weekly; does running / walking and gym / weights Drug/Alcohol Rehab (Currently) no Alcohol use current drinker; drinks wine; only occasionally per week Number of flights of stairs before winded 2-3 Current work status unemployed Living situation live with spouse Illicit drug use no Children 3 Drug/Alcohol Rehab (Previously) no Pain Contract no Marital status married Tobacco / smoke exposure yes  Medication History  Neurontin (300MG  Capsule, 1 (one) Capsule Oral 1 qhs x 3days, 1 bid x 3days, then tid, Taken starting 09/29/2013) Active. (jcb/jmb/ssj) Dilaudid (4MG  Tablet, 1 (one) Tablet Oral every 4-6 hours as needed for pain, Taken starting 10/10/2013) Active. Estradiol (0.5MG  Tablet, Oral) Active. Lexapro (Oral) Specific dose unknown - Active. Wellbutrin SR (Oral) Specific dose unknown - Active. Vitamin D (Ergocalciferol) (50000UNIT Capsule, Oral) Active. Mobic (7.5MG  Tablet, Oral) Active. Medications Reconciled  Past Surgical History  Rotator Cuff Repair left Foot Surgery left Tubal Ligation Hysterectomy complete (non-cancerous)  Other Problems  Anxiety Disorder Gastroesophageal Reflux Disease Liver disease Migraine Headache Oophorectomy bilateral Rheumatoid Arthritis Skin Cancer  Objective Transcription  She is pleasant woman in obvious distress. She is alert. She is oriented times three. She denies incontinence of bowel and bladder. She does have significant when she moves the bowels. She has foot drop with 3/5 S1 strength and 4/5 EHL and tibialis anterior strength. Numbness and  dysesthesias over the S1 distribution on the right side (posterior calf, heel, and sole of the foot). No significant weakness or dysfunction on the left side. Intact peripheral pulses. Compartments are soft and nontender. Positive right straight leg raise test. She has chronic longstanding low back pain, but it is mild to moderate. No shortness of breath or chest pain. Abdomen is soft and nontender.   Assessment & Plan  Displacement of intervertebral disc of lumbosacral region (722.10  M51.27) recurrent right large L5/S1 HNP   Plans Transcription At this point in time I, her, and her husband have had a long discussion. We have talked about fusion surgery or straightforward revision discectomy. At this point in time, her biggest complaint is the leg pain. There are neurological deficits and her quality of life is severely limited because of this. While she did have preexisting low back pain, she never had significant treatment such as injections or chiropractic treatment. She states that the back pain, at its baseline, was discomforting, but it would rarely interfere with her overall quality of life.  Given that, I think it is reasonable to perform a revision lumbar discectomy. We have gone over the risks which include infection, bleeding, nerve damage, death, stroke, paralysis, leak of spinal fluid, recurrent disc herniation, need for further surgery such as fusion surgery, and ongoing or worse pain, and worsening neurological deficits. All of their questions were encouraged and addressed.   No change in exam Right leg pain - positive s leg raise

## 2013-10-13 NOTE — Pre-Procedure Instructions (Signed)
[name]  10/13/2013   Your procedure is scheduled on:  Wed, Aug 5 @ 3:40 PM  Report to [facility] Entrance A  at 1:40 PM.  Call this number if you have problems the morning of surgery: [phone]   Remember:   Do not eat food or drink liquids after midnight.   Take these medicines the morning of surgery with A SIP OF WATER: Wellbutrin(Bupropion),Gabapentin(Neurontin),and Pain Pill(if needed)                             No Goody's,BC's,Aleve,Aspirin,Ibuprofen,Fish Oil,or any Herbal Medications   Do not wear jewelry, make-up or nail polish.  Do not wear lotions, powders, or perfumes. You may wear deodorant.  Do not shave 48 hours prior to surgery.   Do not bring valuables to the hospital.  [facility] is not responsible                  for any belongings or valuables.               Contacts, dentures or bridgework may not be worn into surgery.  Leave suitcase in the car. After surgery it may be brought to your room.  For patients admitted to the hospital, discharge time is determined by your                treatment team.               Patients discharged the day of surgery will not be allowed to drive  home.    Special Instructions:  [place] - Preparing for Surgery  Before surgery, you can play an important role.  Because skin is not sterile, your skin needs to be as free of germs as possible.  You can reduce the number of germs on you skin by washing with CHG (chlorahexidine gluconate) soap before surgery.  CHG is an antiseptic cleaner which kills germs and bonds with the skin to continue killing germs even after washing.  Please DO NOT use if you have an allergy to CHG or antibacterial soaps.  If your skin becomes reddened/irritated stop using the CHG and inform your nurse when you arrive at Short Stay.  Do not shave (including legs and underarms) for at least 48 hours prior to the first CHG shower.  You may shave your face.  Please follow these instructions  carefully:   1.  Shower with CHG Soap the night before surgery and the                                morning of Surgery.  2.  If you choose to wash your hair, wash your hair first as usual with your       normal shampoo.  3.  After you shampoo, rinse your hair and body thoroughly to remove the                      Shampoo.  4.  Use CHG as you would any other liquid soap.  You can apply chg directly       to the skin and wash gently with scrungie or a clean washcloth.  5.  Apply the CHG Soap to your body ONLY FROM THE NECK DOWN.        Do not use on open wounds or open sores.  Avoid contact  with your eyes,       ears, mouth and genitals (private parts).  Wash genitals (private parts)       with your normal soap.  6.  Wash thoroughly, paying special attention to the area where your surgery        will be performed.  7.  Thoroughly rinse your body with warm water from the neck down.  8.  DO NOT shower/wash with your normal soap after using and rinsing off       the CHG Soap.  9.  Pat yourself dry with a clean towel.            10.  Wear clean pajamas.            11.  Place clean sheets on your bed the night of your first shower and do not        sleep with pets.  Day of Surgery  Do not apply any lotions/deoderants the morning of surgery.  Please wear clean clothes to the hospital/surgery center.     Please read over the following fact sheets that you were given: Pain Booklet, Coughing and Deep Breathing, MRSA Information and Surgical Site Infection Prevention

## 2013-10-14 ENCOUNTER — Encounter (HOSPITAL_COMMUNITY): Payer: Self-pay | Admitting: Certified Registered"

## 2013-10-14 ENCOUNTER — Ambulatory Visit (HOSPITAL_COMMUNITY)
Admission: RE | Admit: 2013-10-14 | Discharge: 2013-10-15 | Disposition: A | Discharge status: Home / Self Care | Payer: BC Managed Care – PPO | Source: Ambulatory Visit | Attending: Orthopedic Surgery | Admitting: Orthopedic Surgery

## 2013-10-14 ENCOUNTER — Encounter (HOSPITAL_COMMUNITY): Payer: BC Managed Care – PPO | Admitting: Certified Registered"

## 2013-10-14 ENCOUNTER — Ambulatory Visit (HOSPITAL_COMMUNITY): Payer: BC Managed Care – PPO | Admitting: Certified Registered"

## 2013-10-14 ENCOUNTER — Ambulatory Visit (HOSPITAL_COMMUNITY): Payer: BC Managed Care – PPO

## 2013-10-14 ENCOUNTER — Encounter (HOSPITAL_COMMUNITY)
Admission: RE | Disposition: A | Discharge status: Home / Self Care | Payer: Self-pay | Source: Ambulatory Visit | Attending: Orthopedic Surgery

## 2013-10-14 DIAGNOSIS — M5126 Other intervertebral disc displacement, lumbar region: Secondary | ICD-10-CM | POA: Diagnosis not present

## 2013-10-14 DIAGNOSIS — Z79899 Other long term (current) drug therapy: Secondary | ICD-10-CM | POA: Insufficient documentation

## 2013-10-14 DIAGNOSIS — Z9889 Other specified postprocedural states: Secondary | ICD-10-CM | POA: Insufficient documentation

## 2013-10-14 DIAGNOSIS — K219 Gastro-esophageal reflux disease without esophagitis: Secondary | ICD-10-CM | POA: Insufficient documentation

## 2013-10-14 DIAGNOSIS — Z90710 Acquired absence of both cervix and uterus: Secondary | ICD-10-CM | POA: Insufficient documentation

## 2013-10-14 DIAGNOSIS — F411 Generalized anxiety disorder: Secondary | ICD-10-CM | POA: Insufficient documentation

## 2013-10-14 DIAGNOSIS — IMO0002 Reserved for concepts with insufficient information to code with codable children: Secondary | ICD-10-CM | POA: Diagnosis present

## 2013-10-14 DIAGNOSIS — Z885 Allergy status to narcotic agent status: Secondary | ICD-10-CM | POA: Insufficient documentation

## 2013-10-14 DIAGNOSIS — Z9079 Acquired absence of other genital organ(s): Secondary | ICD-10-CM | POA: Insufficient documentation

## 2013-10-14 DIAGNOSIS — Z87891 Personal history of nicotine dependence: Secondary | ICD-10-CM | POA: Insufficient documentation

## 2013-10-14 DIAGNOSIS — Z85828 Personal history of other malignant neoplasm of skin: Secondary | ICD-10-CM | POA: Insufficient documentation

## 2013-10-14 DIAGNOSIS — M069 Rheumatoid arthritis, unspecified: Secondary | ICD-10-CM | POA: Insufficient documentation

## 2013-10-14 HISTORY — PX: LUMBAR DISC SURGERY: SHX700

## 2013-10-14 HISTORY — PX: LUMBAR LAMINECTOMY/DECOMPRESSION MICRODISCECTOMY: SHX5026

## 2013-10-14 SURGERY — LUMBAR LAMINECTOMY/DECOMPRESSION MICRODISCECTOMY 1 LEVEL
Anesthesia: General | Site: Spine Lumbar | Laterality: Right

## 2013-10-14 MED ORDER — OXYCODONE HCL 5 MG PO TABS
ORAL_TABLET | ORAL | Status: AC
  Administered 2013-10-14: 10 mg via ORAL
  Filled 2013-10-14: qty 2

## 2013-10-14 MED ORDER — FENTANYL CITRATE 0.05 MG/ML IJ SOLN
INTRAMUSCULAR | Status: AC
  Administered 2013-10-14: 100 ug via INTRAVENOUS
  Filled 2013-10-14: qty 2

## 2013-10-14 MED ORDER — 0.9 % SODIUM CHLORIDE (POUR BTL) OPTIME
TOPICAL | Status: DC | PRN
  Administered 2013-10-14: 1000 mL

## 2013-10-14 MED ORDER — SCOPOLAMINE 1 MG/3DAYS TD PT72
1.0000 | MEDICATED_PATCH | Freq: Once | TRANSDERMAL | Status: DC
  Administered 2013-10-14: 1.5 mg via TRANSDERMAL

## 2013-10-14 MED ORDER — ACETAMINOPHEN 10 MG/ML IV SOLN
INTRAVENOUS | Status: AC
  Administered 2013-10-14: 1000 mg via INTRAVENOUS
  Filled 2013-10-14: qty 100

## 2013-10-14 MED ORDER — NEOSTIGMINE METHYLSULFATE 10 MG/10ML IV SOLN
INTRAVENOUS | Status: AC
  Filled 2013-10-14: qty 1

## 2013-10-14 MED ORDER — PROPOFOL 10 MG/ML IV BOLUS
INTRAVENOUS | Status: AC
  Filled 2013-10-14: qty 20

## 2013-10-14 MED ORDER — HEMOSTATIC AGENTS (NO CHARGE) OPTIME
TOPICAL | Status: DC | PRN
  Administered 2013-10-14: 1 via TOPICAL

## 2013-10-14 MED ORDER — PROPOFOL 10 MG/ML IV BOLUS
INTRAVENOUS | Status: DC | PRN
  Administered 2013-10-14: 150 mg via INTRAVENOUS

## 2013-10-14 MED ORDER — ESTRADIOL 1 MG PO TABS
0.5000 mg | ORAL_TABLET | Freq: Two times a day (BID) | ORAL | Status: DC
  Administered 2013-10-14 – 2013-10-15 (×2): 0.5 mg via ORAL
  Filled 2013-10-14 (×3): qty 0.5

## 2013-10-14 MED ORDER — BUPIVACAINE-EPINEPHRINE (PF) 0.25% -1:200000 IJ SOLN
INTRAMUSCULAR | Status: AC
  Filled 2013-10-14: qty 30

## 2013-10-14 MED ORDER — BUPIVACAINE-EPINEPHRINE 0.25% -1:200000 IJ SOLN
INTRAMUSCULAR | Status: DC | PRN
  Administered 2013-10-14: 10 mL

## 2013-10-14 MED ORDER — MORPHINE SULFATE 2 MG/ML IJ SOLN
1.0000 mg | INTRAMUSCULAR | Status: DC | PRN
  Administered 2013-10-14 – 2013-10-15 (×5): 2 mg via INTRAVENOUS
  Filled 2013-10-14 (×5): qty 1

## 2013-10-14 MED ORDER — METHOCARBAMOL 500 MG PO TABS
500.0000 mg | ORAL_TABLET | Freq: Four times a day (QID) | ORAL | Status: DC | PRN
  Administered 2013-10-15 (×2): 500 mg via ORAL
  Filled 2013-10-14 (×2): qty 1

## 2013-10-14 MED ORDER — MIDAZOLAM HCL 2 MG/2ML IJ SOLN
INTRAMUSCULAR | Status: AC
  Administered 2013-10-14: 1 mg
  Filled 2013-10-14: qty 2

## 2013-10-14 MED ORDER — HYDROMORPHONE HCL PF 1 MG/ML IJ SOLN
INTRAMUSCULAR | Status: AC
  Filled 2013-10-14: qty 1

## 2013-10-14 MED ORDER — HYDROMORPHONE HCL PF 1 MG/ML IJ SOLN
0.2500 mg | INTRAMUSCULAR | Status: DC | PRN
  Administered 2013-10-14 (×3): 0.5 mg via INTRAVENOUS

## 2013-10-14 MED ORDER — LACTATED RINGERS IV SOLN
INTRAVENOUS | Status: DC
  Administered 2013-10-14: 22:00:00 via INTRAVENOUS

## 2013-10-14 MED ORDER — SODIUM CHLORIDE 0.9 % IJ SOLN: 3.0000 mL | INTRAMUSCULAR | Status: DC | PRN

## 2013-10-14 MED ORDER — SCOPOLAMINE 1 MG/3DAYS TD PT72
MEDICATED_PATCH | TRANSDERMAL | Status: AC
  Filled 2013-10-14: qty 1

## 2013-10-14 MED ORDER — ONDANSETRON HCL 4 MG/2ML IJ SOLN
INTRAMUSCULAR | Status: AC
  Filled 2013-10-14: qty 2

## 2013-10-14 MED ORDER — LACTATED RINGERS IV SOLN
INTRAVENOUS | Status: DC | PRN
  Administered 2013-10-14 (×2): via INTRAVENOUS

## 2013-10-14 MED ORDER — MIDAZOLAM HCL 5 MG/5ML IJ SOLN
INTRAMUSCULAR | Status: DC | PRN
  Administered 2013-10-14: 2 mg via INTRAVENOUS

## 2013-10-14 MED ORDER — DEXAMETHASONE SODIUM PHOSPHATE 4 MG/ML IJ SOLN
INTRAMUSCULAR | Status: DC | PRN
  Administered 2013-10-14: 8 mg via INTRAVENOUS

## 2013-10-14 MED ORDER — OXYCODONE HCL 5 MG PO TABS: 5.0000 mg | ORAL_TABLET | Freq: Once | ORAL | Status: DC | PRN

## 2013-10-14 MED ORDER — ACETAMINOPHEN 10 MG/ML IV SOLN
1000.0000 mg | Freq: Once | INTRAVENOUS | Status: AC
  Filled 2013-10-14: qty 100

## 2013-10-14 MED ORDER — MIDAZOLAM HCL 2 MG/2ML IJ SOLN
INTRAMUSCULAR | Status: AC
  Filled 2013-10-14: qty 2

## 2013-10-14 MED ORDER — DEXAMETHASONE SODIUM PHOSPHATE 4 MG/ML IJ SOLN
4.0000 mg | Freq: Four times a day (QID) | INTRAMUSCULAR | Status: DC
  Administered 2013-10-14 – 2013-10-15 (×2): 4 mg via INTRAVENOUS
  Filled 2013-10-14 (×2): qty 1

## 2013-10-14 MED ORDER — OXYCODONE HCL 5 MG/5ML PO SOLN: 5.0000 mg | Freq: Once | ORAL | Status: DC | PRN

## 2013-10-14 MED ORDER — LIDOCAINE HCL (CARDIAC) 20 MG/ML IV SOLN
INTRAVENOUS | Status: AC
  Filled 2013-10-14: qty 5

## 2013-10-14 MED ORDER — ONDANSETRON HCL 4 MG/2ML IJ SOLN
INTRAMUSCULAR | Status: DC | PRN
  Administered 2013-10-14: 4 mg via INTRAVENOUS

## 2013-10-14 MED ORDER — SODIUM CHLORIDE 0.9 % IJ SOLN
3.0000 mL | Freq: Two times a day (BID) | INTRAMUSCULAR | Status: DC
  Administered 2013-10-14 – 2013-10-15 (×2): 3 mL via INTRAVENOUS

## 2013-10-14 MED ORDER — ROCURONIUM BROMIDE 50 MG/5ML IV SOLN
INTRAVENOUS | Status: AC
  Filled 2013-10-14: qty 1

## 2013-10-14 MED ORDER — FENTANYL CITRATE 0.05 MG/ML IJ SOLN
INTRAMUSCULAR | Status: DC | PRN
  Administered 2013-10-14: 50 ug via INTRAVENOUS
  Administered 2013-10-14: 150 ug via INTRAVENOUS
  Administered 2013-10-14 (×2): 50 ug via INTRAVENOUS
  Administered 2013-10-14: 100 ug via INTRAVENOUS

## 2013-10-14 MED ORDER — FENTANYL CITRATE 0.05 MG/ML IJ SOLN
100.0000 ug | Freq: Once | INTRAMUSCULAR | Status: AC
  Administered 2013-10-14: 100 ug via INTRAVENOUS

## 2013-10-14 MED ORDER — CEFAZOLIN SODIUM 1-5 GM-% IV SOLN
1.0000 g | Freq: Three times a day (TID) | INTRAVENOUS | Status: AC
  Administered 2013-10-15 (×2): 1 g via INTRAVENOUS
  Filled 2013-10-14 (×2): qty 50

## 2013-10-14 MED ORDER — ROCURONIUM BROMIDE 100 MG/10ML IV SOLN
INTRAVENOUS | Status: DC | PRN
  Administered 2013-10-14: 50 mg via INTRAVENOUS

## 2013-10-14 MED ORDER — FENTANYL CITRATE 0.05 MG/ML IJ SOLN
50.0000 ug | INTRAMUSCULAR | Status: DC | PRN
  Administered 2013-10-14: 50 ug via INTRAVENOUS

## 2013-10-14 MED ORDER — SODIUM CHLORIDE 0.9 % IV SOLN: 250.0000 mL | INTRAVENOUS | Status: DC

## 2013-10-14 MED ORDER — THROMBIN 20000 UNITS EX SOLR
CUTANEOUS | Status: AC
  Filled 2013-10-14: qty 20000

## 2013-10-14 MED ORDER — NEOSTIGMINE METHYLSULFATE 10 MG/10ML IV SOLN
INTRAVENOUS | Status: DC | PRN
  Administered 2013-10-14: 4 mg via INTRAVENOUS

## 2013-10-14 MED ORDER — BUPROPION HCL ER (XL) 150 MG PO TB24
150.0000 mg | ORAL_TABLET | Freq: Every morning | ORAL | Status: DC
  Administered 2013-10-15: 150 mg via ORAL
  Filled 2013-10-14: qty 1

## 2013-10-14 MED ORDER — PHENOL 1.4 % MT LIQD: 1.0000 | OROMUCOSAL | Status: DC | PRN

## 2013-10-14 MED ORDER — GLYCOPYRROLATE 0.2 MG/ML IJ SOLN
INTRAMUSCULAR | Status: DC | PRN
  Administered 2013-10-14: .5 mg via INTRAVENOUS

## 2013-10-14 MED ORDER — DEXAMETHASONE 4 MG PO TABS
4.0000 mg | ORAL_TABLET | Freq: Four times a day (QID) | ORAL | Status: DC
  Administered 2013-10-15: 4 mg via ORAL
  Filled 2013-10-14: qty 1

## 2013-10-14 MED ORDER — HYDROMORPHONE HCL PF 1 MG/ML IJ SOLN
INTRAMUSCULAR | Status: AC
  Administered 2013-10-14: 0.5 mg via INTRAVENOUS
  Filled 2013-10-14: qty 1

## 2013-10-14 MED ORDER — ONDANSETRON HCL 4 MG/2ML IJ SOLN
4.0000 mg | INTRAMUSCULAR | Status: DC | PRN
  Administered 2013-10-15: 4 mg via INTRAVENOUS
  Filled 2013-10-14: qty 2

## 2013-10-14 MED ORDER — MENTHOL 3 MG MT LOZG: 1.0000 | LOZENGE | OROMUCOSAL | Status: DC | PRN

## 2013-10-14 MED ORDER — FENTANYL CITRATE 0.05 MG/ML IJ SOLN
INTRAMUSCULAR | Status: AC
  Filled 2013-10-14: qty 5

## 2013-10-14 MED ORDER — GLYCOPYRROLATE 0.2 MG/ML IJ SOLN
INTRAMUSCULAR | Status: AC
  Filled 2013-10-14: qty 3

## 2013-10-14 MED ORDER — OXYCODONE HCL 5 MG PO TABS
10.0000 mg | ORAL_TABLET | ORAL | Status: DC | PRN
  Administered 2013-10-14 – 2013-10-15 (×4): 10 mg via ORAL
  Filled 2013-10-14 (×3): qty 2

## 2013-10-14 MED ORDER — ESCITALOPRAM OXALATE 10 MG PO TABS
10.0000 mg | ORAL_TABLET | Freq: Every day | ORAL | Status: DC
  Administered 2013-10-14: 10 mg via ORAL
  Filled 2013-10-14: qty 1

## 2013-10-14 MED ORDER — MIDAZOLAM HCL 5 MG/ML IJ SOLN: 1.0000 mg | Freq: Once | INTRAMUSCULAR | Status: DC

## 2013-10-14 MED ORDER — THROMBIN 20000 UNITS EX SOLR
CUTANEOUS | Status: DC | PRN
  Administered 2013-10-14: 18:00:00 via TOPICAL

## 2013-10-14 MED ORDER — LACTATED RINGERS IV SOLN
INTRAVENOUS | Status: DC
  Administered 2013-10-14: 14:00:00 via INTRAVENOUS

## 2013-10-14 MED ORDER — METHOCARBAMOL 1000 MG/10ML IJ SOLN: 500.0000 mg | Freq: Four times a day (QID) | INTRAVENOUS | Status: DC | PRN

## 2013-10-14 MED ORDER — FENTANYL CITRATE 0.05 MG/ML IJ SOLN
INTRAMUSCULAR | Status: AC
  Administered 2013-10-14: 50 ug via INTRAVENOUS
  Filled 2013-10-14: qty 2

## 2013-10-14 MED ORDER — LIDOCAINE HCL (CARDIAC) 20 MG/ML IV SOLN
INTRAVENOUS | Status: DC | PRN
  Administered 2013-10-14: 120 mg via INTRAVENOUS

## 2013-10-14 SURGICAL SUPPLY — 58 items
BUR EGG ELITE 4.0 (BURR) IMPLANT
BUR MATCHSTICK NEURO 3.0 LAGG (BURR) IMPLANT
CANISTER SUCTION 2500CC (MISCELLANEOUS) ×2 IMPLANT
CLSR STERI-STRIP ANTIMIC 1/2X4 (GAUZE/BANDAGES/DRESSINGS) ×2 IMPLANT
CORDS BIPOLAR (ELECTRODE) ×2 IMPLANT
COVER SURGICAL LIGHT HANDLE (MISCELLANEOUS) ×2 IMPLANT
DRAIN CHANNEL 15F RND FF W/TCR (WOUND CARE) ×2 IMPLANT
DRAPE POUCH INSTRU U-SHP 10X18 (DRAPES) ×2 IMPLANT
DRAPE SURG 17X23 STRL (DRAPES) ×2 IMPLANT
DRAPE U-SHAPE 47X51 STRL (DRAPES) ×2 IMPLANT
DRSG MEPILEX BORDER 4X8 (GAUZE/BANDAGES/DRESSINGS) ×2 IMPLANT
DURAPREP 26ML APPLICATOR (WOUND CARE) ×2 IMPLANT
ELECT BLADE 4.0 EZ CLEAN MEGAD (MISCELLANEOUS)
ELECT CAUTERY BLADE 6.4 (BLADE) ×2 IMPLANT
ELECT PENCIL ROCKER SW 15FT (MISCELLANEOUS) ×2 IMPLANT
ELECT REM PT RETURN 9FT ADLT (ELECTROSURGICAL) ×2
ELECTRODE BLDE 4.0 EZ CLN MEGD (MISCELLANEOUS) IMPLANT
ELECTRODE REM PT RTRN 9FT ADLT (ELECTROSURGICAL) ×1 IMPLANT
EVACUATOR SILICONE 100CC (DRAIN) ×2 IMPLANT
GLOVE BIOGEL PI IND STRL 8 (GLOVE) ×1 IMPLANT
GLOVE BIOGEL PI IND STRL 8.5 (GLOVE) ×1 IMPLANT
GLOVE BIOGEL PI INDICATOR 8 (GLOVE) ×1
GLOVE BIOGEL PI INDICATOR 8.5 (GLOVE) ×1
GLOVE ECLIPSE 8.5 STRL (GLOVE) ×2 IMPLANT
GLOVE ORTHO TXT STRL SZ7.5 (GLOVE) ×2 IMPLANT
GOWN STRL REUS W/ TWL LRG LVL3 (GOWN DISPOSABLE) ×1 IMPLANT
GOWN STRL REUS W/ TWL XL LVL3 (GOWN DISPOSABLE) ×2 IMPLANT
GOWN STRL REUS W/TWL 2XL LVL3 (GOWN DISPOSABLE) ×4 IMPLANT
GOWN STRL REUS W/TWL LRG LVL3 (GOWN DISPOSABLE) ×1
GOWN STRL REUS W/TWL XL LVL3 (GOWN DISPOSABLE) ×2
KIT BASIN OR (CUSTOM PROCEDURE TRAY) ×2 IMPLANT
KIT ROOM TURNOVER OR (KITS) ×2 IMPLANT
NEEDLE 22X1 1/2 (OR ONLY) (NEEDLE) ×2 IMPLANT
NEEDLE SPNL 18GX3.5 QUINCKE PK (NEEDLE) ×4 IMPLANT
NS IRRIG 1000ML POUR BTL (IV SOLUTION) ×2 IMPLANT
PACK LAMINECTOMY ORTHO (CUSTOM PROCEDURE TRAY) ×2 IMPLANT
PACK UNIVERSAL I (CUSTOM PROCEDURE TRAY) ×2 IMPLANT
PAD ARMBOARD 7.5X6 YLW CONV (MISCELLANEOUS) ×4 IMPLANT
PATTIES SURGICAL .5 X.5 (GAUZE/BANDAGES/DRESSINGS) IMPLANT
PATTIES SURGICAL .5 X1 (DISPOSABLE) ×2 IMPLANT
SPONGE SURGIFOAM ABS GEL 100 (HEMOSTASIS) IMPLANT
SURGIFLO TRUKIT (HEMOSTASIS) ×2 IMPLANT
SUT BONE WAX W31G (SUTURE) ×2 IMPLANT
SUT MON AB 3-0 SH 27 (SUTURE) ×1
SUT MON AB 3-0 SH27 (SUTURE) ×1 IMPLANT
SUT VIC AB 0 CT1 27 (SUTURE) ×1
SUT VIC AB 0 CT1 27XBRD ANBCTR (SUTURE) ×1 IMPLANT
SUT VIC AB 1 CT1 18XCR BRD 8 (SUTURE) ×1 IMPLANT
SUT VIC AB 1 CT1 8-18 (SUTURE) ×1
SUT VIC AB 1 CTX 36 (SUTURE) ×2
SUT VIC AB 1 CTX36XBRD ANBCTR (SUTURE) ×2 IMPLANT
SUT VIC AB 2-0 CT1 18 (SUTURE) ×2 IMPLANT
SYR BULB IRRIGATION 50ML (SYRINGE) ×2 IMPLANT
SYR CONTROL 10ML LL (SYRINGE) ×2 IMPLANT
TOWEL OR 17X24 6PK STRL BLUE (TOWEL DISPOSABLE) ×2 IMPLANT
TOWEL OR 17X26 10 PK STRL BLUE (TOWEL DISPOSABLE) ×2 IMPLANT
WATER STERILE IRR 1000ML POUR (IV SOLUTION) ×2 IMPLANT
YANKAUER SUCT BULB TIP NO VENT (SUCTIONS) ×2 IMPLANT

## 2013-10-14 NOTE — Op Note (Signed)
[name], [name] NO.:  [id number]  MEDICAL RECORD NO.:  [id number]  LOCATION:  [id number]                        FACILITY:  [facility]  PHYSICIAN:  Alvy Beal, MD    DATE OF BIRTH:  1960/04/28  DATE OF PROCEDURE:  10/14/2013 DATE OF DISCHARGE:                              OPERATIVE REPORT   PREOPERATIVE DIAGNOSIS:  Recurrent right posterior lateral disk herniation, L5-S1.  POSTOPERATIVE DIAGNOSIS:  Recurrent right posterior lateral disk herniation, L5-S1.  OPERATIVE PROCEDURE:  Revision L5-S1 diskectomy for recurrent disk herniation.  SURGEON:  Alvy Beal, MD  FIRST ASSISTANT:  Genene Churn. Denton Meek.  COMPLICATIONS:  None.  HISTORY:  This is a very pleasant woman who, about 6 weeks ago, underwent an L5-S1 diskectomy, and did exceptionally well.  Shortly thereafter, she returned to work and had significant recurrent leg pain. As a result, a repeat MRI was done which confirmed a recurrent large posterolateral disk herniation.  She was referred to me for further workup and treatment.  After discussing treatment options, she elected to proceed with a revision diskectomy.  All appropriate risks, benefits, and alternatives to surgery were discussed, and consent was obtained.  OPERATIVE NOTE:  The patient was brought to the operating room, placed supine on the operating table.  After successful induction of general anesthesia and endotracheal intubation, TEDs, SCDs were applied.  She was turned prone onto a Wilson frame.  All bony prominences were well padded and the back was prepped and draped in a standard fashion.  Time- out was taken to confirm the patient, procedure, and all other pertinent important data.  Once this was completed, the patient was prepped and draped in a standard fashion.  The previous incision was re-incised and was extended both cranially and caudally.  Sharp dissection was carried out down to the deep fascia.  The deep fascia was  sharply incised and I palpated down to the S1 spinous process and lamina.  Based on the preoperative imaging, this was intact.  Once I had a bony landmark, I then went and found the remaining L5 lamina and exposed this.  At this point, I had the superior and inferior aspects of the surgical site perfectly visualized.  I placed a Penfield 4 underneath the lamina of L5.  I took an intraoperative x-ray and confirmed that this was the appropriate level.  Once this was done, I then used a small nerve curette to create a plane between the scar and the lamina.  I then extended the L5 laminotomy superiorly and identified ligamentum flavum. Once I identified the ligamentum flavum, I began resecting all of the scar tissue that was superficial to this level.  Once this was done, I was able to get into the posterolateral recess.  I then palpated the S1 pedicle and then tracked the S1 nerve root and into the foramen.  At this point, I could now see the posterolateral aspect of the disk space. I performed an annulotomy and then used a micropituitary rongeur to create a defect in the disk which allowed me to collapse the disk herniation into the defect.  There was a very large posterolateral disk herniation which I removed  in two pieces, consistent with the preoperative imaging study.  Once this disk fragment was out, I could then easily palpate inferiorly along the S1 nerve root into the foramen, superior and lateral recess, and centrally circumferentially.  The S1 nerve root was visualized and was no longer under tension.  At this point, I was pleased with the diskectomy, decompression, and I felt as though I had removed all of the pathology.  At this point, I irrigated the wound copiously with normal saline, used bipolar electrocautery to obtain hemostasis and then closed the deep fascia with interrupted #1 Vicryl sutures, then a running 0 Vicryl, then interrupted 2-0 Vicryl sutures and a 3-0  Monocryl.  Steri-Strips and dry dressing were applied. The patient was then extubated, transferred to the PACU without incident.  At the end of the case, all needle and sponge counts were correct.     Alvy Beal, MD     DDB/MEDQ  D:  10/14/2013  T:  10/14/2013  Job:  [id number]

## 2013-10-14 NOTE — Brief Op Note (Signed)
10/14/2013  6:27 PM  PATIENT:  [name]  53 y.o. female  PRE-OPERATIVE DIAGNOSIS:  RECURRENT L5-S1 RIGHT HNP  POST-OPERATIVE DIAGNOSIS:  RECURRENT L5-S1 RIGHT HNP  PROCEDURE:  Procedure(s): REVISION RIGHT L5-S1 DISCECTOMY  (Right)  SURGEON:  Surgeon(s) and Role:    * Venita Lick, MD - Primary  PHYSICIAN ASSISTANT:   ASSISTANTS: Zonia Kief  ANESTHESIA:   general  EBL:  Total I/O In: 1000 [I.V.:1000] Out: 75 [Blood:75]  BLOOD ADMINISTERED:none  DRAINS: none   LOCAL MEDICATIONS USED:  MARCAINE     SPECIMEN:  No Specimen  DISPOSITION OF SPECIMEN:  N/A  COUNTS:  YES  TOURNIQUET:  * No tourniquets in log *  DICTATION: .Other Dictation: Dictation Number [id number]  PLAN OF CARE: Admit for overnight observation  PATIENT DISPOSITION:  PACU - hemodynamically stable.

## 2013-10-14 NOTE — Transfer of Care (Signed)
Immediate Anesthesia Transfer of Care Note  Patient: [name]  Procedure(s) Performed: Procedure(s): REVISION RIGHT L5-S1 DISCECTOMY  (Right)  Patient Location: PACU  Anesthesia Type:General  Level of Consciousness: awake and alert   Airway & Oxygen Therapy: Patient Spontanous Breathing and Patient connected to nasal cannula oxygen  Post-op Assessment: Report given to PACU RN and Post -op Vital signs reviewed and stable  Post vital signs: Reviewed and stable  Complications: No apparent anesthesia complications

## 2013-10-14 NOTE — Anesthesia Procedure Notes (Signed)
Procedure Name: Intubation Date/Time: 10/14/2013 4:53 PM Performed by: Armandina Gemma Pre-anesthesia Checklist: Patient identified, Timeout performed, Emergency Drugs available, Suction available and Patient being monitored Patient Re-evaluated:Patient Re-evaluated prior to inductionPreoxygenation: Pre-oxygenation with 100% oxygen Intubation Type: IV induction Ventilation: Mask ventilation without difficulty Laryngoscope Size: Miller and 2 Grade View: Grade I Tube type: Oral Number of attempts: 1 Airway Equipment and Method: Stylet Placement Confirmation: ETT inserted through vocal cords under direct vision,  breath sounds checked- equal and bilateral and positive ETCO2 Secured at: 21 cm Tube secured with: Tape Dental Injury: Teeth and Oropharynx as per pre-operative assessment  Comments: IV induction Fitzgerald- intubation AM CRNA- atraumatic teeth and mouth as preop- Bilat BS Sampson Goon

## 2013-10-14 NOTE — Anesthesia Postprocedure Evaluation (Signed)
  Anesthesia Post-op Note  Patient: [name]  Procedure(s) Performed: Procedure(s): REVISION RIGHT L5-S1 DISCECTOMY  (Right)  Patient Location: PACU  Anesthesia Type:General  Level of Consciousness: awake, alert , oriented and patient cooperative  Airway and Oxygen Therapy: Patient Spontanous Breathing  Post-op Pain: moderate  Post-op Assessment: Post-op Vital signs reviewed, Patient's Cardiovascular Status Stable, Respiratory Function Stable, Patent Airway and No signs of Nausea or vomiting  Post-op Vital Signs: stable  Last Vitals:  Filed Vitals:   10/14/13 2023  BP:   Pulse: 78  Temp: 36.8 C  Resp: 16    Complications: No apparent anesthesia complications

## 2013-10-14 NOTE — Anesthesia Preprocedure Evaluation (Signed)
Anesthesia Evaluation  Patient identified by MRN, date of birth, ID band Patient awake    Reviewed: Allergy & Precautions, H&P , NPO status , Patient's Chart, lab work & pertinent test results  Airway Mallampati: III TM Distance: >3 FB Neck ROM: Full    Dental no notable dental hx. (+) Teeth Intact, Dental Advisory Given   Pulmonary neg pulmonary ROS,  breath sounds clear to auscultation  Pulmonary exam normal       Cardiovascular negative cardio ROS  Rhythm:Regular Rate:Normal     Neuro/Psych  Headaches, Anxiety Depression    GI/Hepatic Neg liver ROS, GERD-  Controlled,  Endo/Other  negative endocrine ROS  Renal/GU negative Renal ROS  negative genitourinary   Musculoskeletal   Abdominal   Peds  Hematology negative hematology ROS (+)   Anesthesia Other Findings   Reproductive/Obstetrics negative OB ROS                           Anesthesia Physical Anesthesia Plan  ASA: II  Anesthesia Plan: General   Post-op Pain Management:    Induction: Intravenous  Airway Management Planned: Oral ETT  Additional Equipment:   Intra-op Plan:   Post-operative Plan: Extubation in OR  Informed Consent: I have reviewed the patients History and Physical, chart, labs and discussed the procedure including the risks, benefits and alternatives for the proposed anesthesia with the patient or authorized representative who has indicated his/her understanding and acceptance.   Dental advisory given  Plan Discussed with: CRNA  Anesthesia Plan Comments:         Anesthesia Quick Evaluation

## 2013-10-15 ENCOUNTER — Encounter (HOSPITAL_COMMUNITY): Payer: Self-pay | Admitting: General Practice

## 2013-10-15 DIAGNOSIS — M5126 Other intervertebral disc displacement, lumbar region: Secondary | ICD-10-CM | POA: Diagnosis not present

## 2013-10-15 MED ORDER — ONDANSETRON 4 MG PO TBDP: 4.0000 mg | ORAL_TABLET | Freq: Three times a day (TID) | ORAL | Status: DC | PRN

## 2013-10-15 MED ORDER — OXYCODONE HCL 5 MG PO TABS: 5.0000 mg | ORAL_TABLET | Freq: Four times a day (QID) | ORAL | Status: DC | PRN

## 2013-10-15 MED ORDER — OXYCODONE-ACETAMINOPHEN 5-325 MG PO TABS: 1.0000 | ORAL_TABLET | ORAL | Status: DC | PRN

## 2013-10-15 MED ORDER — POLYETHYLENE GLYCOL 3350 17 G PO PACK: 17.0000 g | PACK | Freq: Every day | ORAL | Status: DC

## 2013-10-15 MED ORDER — METHOCARBAMOL 500 MG PO TABS: 500.0000 mg | ORAL_TABLET | Freq: Three times a day (TID) | ORAL | Status: DC | PRN

## 2013-10-15 NOTE — Evaluation (Signed)
Physical Therapy Evaluation Patient Details Name: [name] MRN: [id number] DOB: 1960-10-17 Today's Date: 10/15/2013   History of Present Illness  53 yo female s/p revision L5- S1 discectomy   Clinical Impression  All education completed, mobility has been assessed and pt understands all mobility safety issues.  She is at a supervision level at this point and should progress to independence quickly.  Will sign off at this time.    Follow Up Recommendations No PT follow up    Equipment Recommendations  None recommended by PT    Recommendations for Other Services       Precautions / Restrictions Precautions Precautions: Back Precaution Comments: handout provided and reviewed Required Braces or Orthoses: Spinal Brace Spinal Brace: Lumbar corset;Applied in sitting position      Mobility  Bed Mobility Overal bed mobility: Needs Assistance Bed Mobility: Sidelying to Sit;Rolling Rolling: Min guard Sidelying to sit: Min assist Supine to sit: Min guard     General bed mobility comments: reinforced/demo'd safe technique. min assist side to sit due to sore hand  Transfers Overall transfer level: Needs assistance Equipment used: Rolling walker (2 wheeled) Transfers: Sit to/from Stand Sit to Stand: Supervision         General transfer comment: cues for safety  Ambulation/Gait Ambulation/Gait assistance: Supervision Ambulation Distance (Feet): 200 Feet Assistive device: Rolling walker (2 wheeled) Gait Pattern/deviations: Step-through pattern   Gait velocity interpretation: at or above normal speed for age/gender General Gait Details: steady with minimal use of the RW  Stairs Stairs: Yes Stairs assistance: Supervision Stair Management: One rail Left;Alternating pattern;Step to pattern;Forwards Number of Stairs: 3 General stair comments: steady with Presenter, broadcasting Rankin (Stroke Patients Only)       Balance Overall balance assessment:  No apparent balance deficits (not formally assessed)                                           Pertinent Vitals/Pain Pain Assessment: No/denies pain    Home Living Family/patient expects to be discharged to:: Private residence Living Arrangements: Spouse/significant other Available Help at Discharge: Family Type of Home: House Home Access: Stairs to enter Entrance Stairs-Rails: Right Entrance Stairs-Number of Steps: 4 Home Layout: Two level Home Equipment: Environmental consultant - 2 wheels;Cane - single point;Shower seat;Adaptive equipment      Prior Function Level of Independence: Needs assistance         Comments: has lift chair at home, grab bars, shower chair , bed with elevating head and legs,     Hand Dominance   Dominant Hand: Right    Extremity/Trunk Assessment   Upper Extremity Assessment: Defer to OT evaluation           Lower Extremity Assessment: Overall WFL for tasks assessed      Cervical / Trunk Assessment: Normal  Communication   Communication: No difficulties  Cognition Arousal/Alertness: Awake/alert Behavior During Therapy: WFL for tasks assessed/performed Overall Cognitive Status: Within Functional Limits for tasks assessed                      General Comments General comments (skin integrity, edema, etc.): Reinforced all back education and prec., bracing issues and progression of activity    Exercises        Assessment/Plan    PT Assessment Patent does not need any further PT services  PT  Diagnosis     PT Problem List    PT Treatment Interventions     PT Goals (Current goals can be found in the Care Plan section) Acute Rehab PT Goals Patient Stated Goal: Eventually back to work after fully healing PT Goal Formulation: No goals set, d/c therapy    Frequency     Barriers to discharge        Co-evaluation               End of Session   Activity Tolerance: Patient tolerated treatment well Patient left:  in chair;with call bell/phone within reach;with family/visitor present Nurse Communication: Mobility status    Functional Assessment Tool Used: clinical judgement Functional Limitation: Mobility: Walking and moving around Mobility: Walking and Moving Around Current Status (B0488): At least 1 percent but less than 20 percent impaired, limited or restricted Mobility: Walking and Moving Around Goal Status [phone]): At least 1 percent but less than 20 percent impaired, limited or restricted Mobility: Walking and Moving Around Discharge Status [phone]): At least 1 percent but less than 20 percent impaired, limited or restricted    Time: 8828-0034 PT Time Calculation (min): 27 min   Charges:   PT Evaluation $Initial PT Evaluation Tier I: 1 Procedure PT Treatments $Gait Training: 8-22 mins   PT G Codes:   Functional Assessment Tool Used: clinical judgement Functional Limitation: Mobility: Walking and moving around    Novaleigh Kohlman, Eliseo Gum 10/15/2013, 4:23 PM 10/15/2013  Salem Bing, PT [phone]  (pager)

## 2013-10-15 NOTE — Clinical Social Work Note (Signed)
CSW consulted for possible SNF placement at time of discharge. CSW to continue to follow and assist with discharge as disposition determined. Thank you for the referral.  Marcelline Deist, MSW, [facility] Licensed Clinical Social Worker [phone] and [phone]

## 2013-10-15 NOTE — Evaluation (Signed)
Occupational Therapy Evaluation Patient Details Name: [name] MRN: [id number] DOB: 1960-09-10 Today's Date: 10/15/2013    History of Present Illness 53 yo female s/p revision L5- S1 discectomy    Clinical Impression   Patient evaluated by Occupational Therapy with no further acute OT needs identified. All education has been completed and the patient has no further questions. See below for any follow-up Occupational Therapy or equipment needs. OT to sign off. Thank you for referral.      Follow Up Recommendations  No OT follow up    Equipment Recommendations  None recommended by OT    Recommendations for Other Services       Precautions / Restrictions Precautions Precautions: Back Precaution Comments: handout provided and reviewed Required Braces or Orthoses: Spinal Brace Spinal Brace: Lumbar corset;Applied in sitting position      Mobility Bed Mobility Overal bed mobility: Needs Assistance Bed Mobility: Supine to Sit     Supine to sit: Min guard     General bed mobility comments: encouragement not to use bed rail  Transfers Overall transfer level: Needs assistance Equipment used: Rolling walker (2 wheeled) Transfers: Sit to/from Stand Sit to Stand: Supervision              Balance                                            ADL Overall ADL's : Needs assistance/impaired Eating/Feeding: Modified independent   Grooming: Wash/dry face;Wash/dry hands;Supervision/safety       Lower Body Bathing: Maximal assistance;Sit to/from stand (from husband and has AE for home use)           Toilet Transfer: Supervision/safety;Ambulation;RW   Toileting- Clothing Manipulation and Hygiene: Supervision/safety       Functional mobility during ADLs: Supervision/safety;Rolling walker General ADL Comments: pt with all education complete at this time. Educated on bed mobility, proper seating in the home, proper seating in the car, positioning  of shower chair,don off back brace, wear schedule for brace,  LB dressing and back precautions     Vision                     Perception     Praxis      Pertinent Vitals/Pain Pain Assessment: No/denies pain     Hand Dominance Right   Extremity/Trunk Assessment Upper Extremity Assessment Upper Extremity Assessment: Overall WFL for tasks assessed   Lower Extremity Assessment Lower Extremity Assessment: Defer to PT evaluation   Cervical / Trunk Assessment Cervical / Trunk Assessment: Normal   Communication Communication Communication: No difficulties   Cognition Arousal/Alertness: Awake/alert Behavior During Therapy: WFL for tasks assessed/performed Overall Cognitive Status: Within Functional Limits for tasks assessed                     General Comments       Exercises       Shoulder Instructions      Home Living Family/patient expects to be discharged to:: Private residence Living Arrangements: Spouse/significant other Available Help at Discharge: Family Type of Home: House Home Access: Stairs to enter Secretary/administrator of Steps: 4 Entrance Stairs-Rails: Right Home Layout: Two level Alternate Level Stairs-Number of Steps: 2 Alternate Level Stairs-Rails: None Bathroom Shower/Tub: Chief Strategy Officer: Standard     Home Equipment: Environmental consultant - 2 wheels;Cane - single point;Shower  seat;Adaptive equipment Adaptive Equipment: Reacher;Sock aid;Long-handled shoe horn;Long-handled sponge        Prior Functioning/Environment Level of Independence: Needs assistance        Comments: has lift chair at home, grab bars, shower chair , bed with elevating head and legs,    OT Diagnosis:     OT Problem List:     OT Treatment/Interventions:      OT Goals(Current goals can be found in the care plan section)    OT Frequency:     Barriers to D/C:            Co-evaluation              End of Session Equipment Utilized  During Treatment: Gait belt;Back brace;Rolling walker Nurse Communication: Mobility status;Precautions  Activity Tolerance: Patient tolerated treatment well Patient left: in chair;with call bell/phone within reach;with family/visitor present   Time: 1121-1154 (431-540 first attempt) OT Time Calculation (min): 33 min Charges:  OT General Charges $OT Visit: 1 Procedure OT Evaluation $Initial OT Evaluation Tier I: 1 Procedure OT Treatments $Self Care/Home Management : 38-52 mins G-Codes: OT G-codes **NOT FOR INPATIENT CLASS** Functional Assessment Tool Used: clinical observation Functional Limitation: Self care Self Care Current Status (G8676): At least 1 percent but less than 20 percent impaired, limited or restricted Self Care Goal Status (P9509): At least 1 percent but less than 20 percent impaired, limited or restricted Self Care Discharge Status [phone]): At least 1 percent but less than 20 percent impaired, limited or restricted  [name] 10/15/2013, 2:37 PM Pager: [phone]

## 2013-10-15 NOTE — Progress Notes (Signed)
    Subjective: Procedure(s) (LRB): REVISION RIGHT L5-S1 DISCECTOMY  (Right) 1 Day Post-Op  Patient reports pain as 0 on 0-10 scale.  Reports decreased leg pain reports incisional back pain   Positive void Negative bowel movement Positive flatus Negative chest pain or shortness of breath  Objective: Vital signs in last 24 hours: Temp:  [97.5 F (36.4 C)-98.6 F (37 C)] 97.7 F (36.5 C) (08/06 0547) Pulse Rate:  [64-96] 90 (08/06 0547) Resp:  [13-20] 18 (08/06 0547) BP: (103-129)/(44-75) 103/67 mmHg (08/06 0547) SpO2:  [91 %-100 %] 95 % (08/06 0547) Weight:  [187 lb (84.823 kg)] 187 lb (84.823 kg) (08/05 2023)  Intake/Output from previous day: 08/05 0701 - 08/06 0700 In: 1300 [I.V.:1300] Out: 875 [Urine:800; Blood:75]  Labs:  Recent Labs  10/13/13 1028  WBC 9.4  RBC 4.35  HCT 40.4  PLT 266   No results found for this basename: NA, K, CL, CO2, BUN, CREATININE, GLUCOSE, CALCIUM,  in the last 72 hours No results found for this basename: LABPT, INR,  in the last 72 hours  Physical Exam: Neurologically intact ABD soft Neurovascular intact Intact pulses distally Dorsiflexion/Plantar flexion intact Incision: dressing C/D/I and no drainage Compartment soft  Assessment/Plan: Patient stable  xrays n/a Continue mobilization with physical therapy Continue care  Advance diet Up with therapy D/C IV fluids D/c to home later this afternoon.  Venita Lick, MD [facility] [phone]

## 2013-10-15 NOTE — Progress Notes (Signed)
Received pt form PACU via bed a&o x 4. Vital taken, pt and family oriented to room and unit. Call bell within reach. No c/o pain at the moment. Pt given incentive spirometer and shown how to use it. Will cont to monitor.

## 2013-10-15 NOTE — Progress Notes (Signed)
Patient ID: [name], female   DOB: Nov 24, 1960, 53 y.o.   MRN: [id number] Patient seen this afternoon.  Doing well.  Ambulating without difficulty. Stable and ready for discharge home this afternoon as previously ordered.

## 2014-03-08 ENCOUNTER — Encounter: Payer: Self-pay | Admitting: Podiatry

## 2014-03-08 ENCOUNTER — Ambulatory Visit (INDEPENDENT_AMBULATORY_CARE_PROVIDER_SITE_OTHER): Payer: BC Managed Care – PPO | Admitting: Podiatry

## 2014-03-08 ENCOUNTER — Ambulatory Visit (INDEPENDENT_AMBULATORY_CARE_PROVIDER_SITE_OTHER): Payer: BC Managed Care – PPO

## 2014-03-08 VITALS — BP 106/69 | HR 101 | Resp 16

## 2014-03-08 DIAGNOSIS — M779 Enthesopathy, unspecified: Secondary | ICD-10-CM

## 2014-03-08 MED ORDER — TRIAMCINOLONE ACETONIDE 10 MG/ML IJ SUSP
10.0000 mg | Freq: Once | INTRAMUSCULAR | Status: AC
Start: 2014-03-08 — End: 2014-03-08
  Administered 2014-03-08: 10 mg

## 2014-03-10 NOTE — Progress Notes (Signed)
Subjective:     Patient ID: [name], female   DOB: [birth year], 53 y.o.   MRN: [id number]  HPI patient presents with pain in the outside of the left foot for several weeks. Does not remember specific injury but it seems to be difficult to walk with   Review of Systems     Objective:   Physical Exam Neurovascular status intact with tendinitis of the left lateral foot that's painful when pressed and making ambulation difficult    Assessment:     Tendinitis left lateral foot insertion of peroneal tendon into base of fifth metatarsal    Plan:     Injected the tendon insertion 3 mg Kenalog 5 mg Xylocaine and instructed on ice therapy and reduced activity. Reappoint if symptoms persist

## 2014-05-03 ENCOUNTER — Emergency Department (HOSPITAL_COMMUNITY)
Admission: EM | Admit: 2014-05-03 | Discharge: 2014-05-03 | Disposition: A | Discharge status: Home / Self Care | Payer: BLUE CROSS/BLUE SHIELD | Attending: Emergency Medicine | Admitting: Emergency Medicine

## 2014-05-03 ENCOUNTER — Emergency Department (HOSPITAL_COMMUNITY): Payer: BLUE CROSS/BLUE SHIELD

## 2014-05-03 DIAGNOSIS — Z79818 Long term (current) use of other agents affecting estrogen receptors and estrogen levels: Secondary | ICD-10-CM | POA: Insufficient documentation

## 2014-05-03 DIAGNOSIS — Z872 Personal history of diseases of the skin and subcutaneous tissue: Secondary | ICD-10-CM | POA: Insufficient documentation

## 2014-05-03 DIAGNOSIS — F418 Other specified anxiety disorders: Secondary | ICD-10-CM | POA: Insufficient documentation

## 2014-05-03 DIAGNOSIS — S4352XA Sprain of left acromioclavicular joint, initial encounter: Secondary | ICD-10-CM | POA: Diagnosis not present

## 2014-05-03 DIAGNOSIS — K219 Gastro-esophageal reflux disease without esophagitis: Secondary | ICD-10-CM | POA: Insufficient documentation

## 2014-05-03 DIAGNOSIS — Z79899 Other long term (current) drug therapy: Secondary | ICD-10-CM | POA: Diagnosis not present

## 2014-05-03 DIAGNOSIS — Y92410 Unspecified street and highway as the place of occurrence of the external cause: Secondary | ICD-10-CM | POA: Insufficient documentation

## 2014-05-03 DIAGNOSIS — K5900 Constipation, unspecified: Secondary | ICD-10-CM | POA: Diagnosis not present

## 2014-05-03 DIAGNOSIS — S39012A Strain of muscle, fascia and tendon of lower back, initial encounter: Secondary | ICD-10-CM | POA: Diagnosis not present

## 2014-05-03 DIAGNOSIS — Z8709 Personal history of other diseases of the respiratory system: Secondary | ICD-10-CM | POA: Insufficient documentation

## 2014-05-03 DIAGNOSIS — Y998 Other external cause status: Secondary | ICD-10-CM | POA: Diagnosis not present

## 2014-05-03 DIAGNOSIS — Y9389 Activity, other specified: Secondary | ICD-10-CM | POA: Diagnosis not present

## 2014-05-03 DIAGNOSIS — E559 Vitamin D deficiency, unspecified: Secondary | ICD-10-CM | POA: Diagnosis not present

## 2014-05-03 DIAGNOSIS — S139XXA Sprain of joints and ligaments of unspecified parts of neck, initial encounter: Secondary | ICD-10-CM

## 2014-05-03 DIAGNOSIS — S134XXA Sprain of ligaments of cervical spine, initial encounter: Secondary | ICD-10-CM | POA: Insufficient documentation

## 2014-05-03 DIAGNOSIS — R52 Pain, unspecified: Secondary | ICD-10-CM

## 2014-05-03 DIAGNOSIS — Z8739 Personal history of other diseases of the musculoskeletal system and connective tissue: Secondary | ICD-10-CM | POA: Diagnosis not present

## 2014-05-03 DIAGNOSIS — G4700 Insomnia, unspecified: Secondary | ICD-10-CM | POA: Diagnosis not present

## 2014-05-03 DIAGNOSIS — S3992XA Unspecified injury of lower back, initial encounter: Secondary | ICD-10-CM | POA: Diagnosis present

## 2014-05-03 MED ORDER — METHOCARBAMOL 500 MG PO TABS: 500.0000 mg | ORAL_TABLET | Freq: Three times a day (TID) | ORAL | Status: DC | PRN

## 2014-05-03 MED ORDER — OXYCODONE-ACETAMINOPHEN 5-325 MG PO TABS
1.0000 | ORAL_TABLET | Freq: Once | ORAL | Status: AC
  Administered 2014-05-03: 1 via ORAL
  Filled 2014-05-03: qty 1

## 2014-05-03 MED ORDER — OXYCODONE HCL 5 MG PO TABS: 5.0000 mg | ORAL_TABLET | Freq: Four times a day (QID) | ORAL | Status: DC | PRN

## 2014-05-03 NOTE — ED Notes (Signed)
Bed: WA05 Expected date:  Expected time:  Means of arrival:  Comments: EMS 
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
## 2014-06-07 ENCOUNTER — Telehealth: Payer: Self-pay | Admitting: *Deleted

## 2014-06-07 NOTE — Telephone Encounter (Signed)
I was speaking to patient and trying to schedule an appointment and we got cut off due to a drop call I believe. I called the patient's mobile number back and left a message to call back so we can get her scheduled. She stated she may want to wait until next week to see Dr. Al Corpus but I am unsure since we do not have an early appointment time available.

## 2014-06-07 NOTE — Telephone Encounter (Signed)
Pt states she receive an injection in the side of her left foot at the last visit, but has returned worse, now is red and swollen with pain.  I encouraged pt to rest, ice and go into a stiff shoe, and schedule an appt this week.  Pt agreed and was transferred to schedulers.

## 2014-06-18 ENCOUNTER — Other Ambulatory Visit: Payer: Self-pay | Admitting: Physician Assistant

## 2014-06-28 ENCOUNTER — Encounter (HOSPITAL_COMMUNITY): Payer: Self-pay | Admitting: Emergency Medicine

## 2014-06-28 ENCOUNTER — Emergency Department (HOSPITAL_COMMUNITY): Payer: BLUE CROSS/BLUE SHIELD

## 2014-06-28 ENCOUNTER — Emergency Department (HOSPITAL_COMMUNITY)
Admission: EM | Admit: 2014-06-28 | Discharge: 2014-06-28 | Disposition: A | Discharge status: Home / Self Care | Payer: BLUE CROSS/BLUE SHIELD | Attending: Emergency Medicine | Admitting: Emergency Medicine

## 2014-06-28 DIAGNOSIS — Z79899 Other long term (current) drug therapy: Secondary | ICD-10-CM | POA: Insufficient documentation

## 2014-06-28 DIAGNOSIS — Z872 Personal history of diseases of the skin and subcutaneous tissue: Secondary | ICD-10-CM | POA: Insufficient documentation

## 2014-06-28 DIAGNOSIS — Z8669 Personal history of other diseases of the nervous system and sense organs: Secondary | ICD-10-CM | POA: Insufficient documentation

## 2014-06-28 DIAGNOSIS — Z8739 Personal history of other diseases of the musculoskeletal system and connective tissue: Secondary | ICD-10-CM | POA: Insufficient documentation

## 2014-06-28 DIAGNOSIS — F418 Other specified anxiety disorders: Secondary | ICD-10-CM | POA: Insufficient documentation

## 2014-06-28 DIAGNOSIS — K219 Gastro-esophageal reflux disease without esophagitis: Secondary | ICD-10-CM | POA: Diagnosis not present

## 2014-06-28 DIAGNOSIS — R079 Chest pain, unspecified: Secondary | ICD-10-CM | POA: Diagnosis not present

## 2014-06-28 LAB — CBC
HCT: 39.6 % (ref 36.0–46.0)
HEMOGLOBIN: 13.1 g/dL (ref 12.0–15.0)
MCH: 29.9 pg (ref 26.0–34.0)
MCHC: 33.1 g/dL (ref 30.0–36.0)
MCV: 90.4 fL (ref 78.0–100.0)
Platelets: 272 10*3/uL (ref 150–400)
RBC: 4.38 MIL/uL (ref 3.87–5.11)
RDW: 12.4 % (ref 11.5–15.5)
WBC: 7.4 10*3/uL (ref 4.0–10.5)

## 2014-06-28 LAB — BASIC METABOLIC PANEL
ANION GAP: 9 (ref 5–15)
BUN: 14 mg/dL (ref 6–23)
CHLORIDE: 104 mmol/L (ref 96–112)
CO2: 23 mmol/L (ref 19–32)
Calcium: 8.8 mg/dL (ref 8.4–10.5)
Creatinine, Ser: 0.67 mg/dL (ref 0.50–1.10)
GFR calc non Af Amer: >90 mL/min (ref >90)
Glucose, Bld: 115 mg/dL — ABNORMAL HIGH (ref 70–99)
Potassium: 3.7 mmol/L (ref 3.5–5.1)
SODIUM: 136 mmol/L (ref 135–145)

## 2014-06-28 LAB — I-STAT TROPONIN, ED
TROPONIN I, POC: 0 ng/mL (ref 0.00–0.08)
Troponin i, poc: 0 ng/mL (ref 0.00–0.08)

## 2014-06-28 LAB — D-DIMER, QUANTITATIVE: D-Dimer, Quant: 0.92 ug/mL-FEU — ABNORMAL HIGH (ref 0.00–0.48)

## 2014-06-28 MED ORDER — IOHEXOL 350 MG/ML SOLN
100.0000 mL | Freq: Once | INTRAVENOUS | Status: AC | PRN
  Administered 2014-06-28: 100 mL via INTRAVENOUS

## 2014-06-28 MED ORDER — MORPHINE SULFATE 4 MG/ML IJ SOLN
4.0000 mg | Freq: Once | INTRAMUSCULAR | Status: AC
  Administered 2014-06-28: 4 mg via INTRAVENOUS
  Filled 2014-06-28: qty 1

## 2014-06-28 MED ORDER — ONDANSETRON HCL 4 MG PO TABS: 4.0000 mg | ORAL_TABLET | Freq: Four times a day (QID) | ORAL | Status: DC

## 2014-06-28 MED ORDER — NAPROXEN 500 MG PO TABS: 500.0000 mg | ORAL_TABLET | Freq: Two times a day (BID) | ORAL | Status: DC

## 2014-06-28 MED ORDER — ONDANSETRON HCL 4 MG/2ML IJ SOLN
4.0000 mg | Freq: Once | INTRAMUSCULAR | Status: AC
  Administered 2014-06-28: 4 mg via INTRAVENOUS
  Filled 2014-06-28: qty 2

## 2014-06-28 MED ORDER — ASPIRIN 81 MG PO CHEW
324.0000 mg | CHEWABLE_TABLET | Freq: Once | ORAL | Status: AC
  Administered 2014-06-28: 324 mg via ORAL
  Filled 2014-06-28: qty 4

## 2014-06-28 MED ORDER — LORAZEPAM 2 MG/ML IJ SOLN
1.0000 mg | Freq: Once | INTRAMUSCULAR | Status: AC
  Administered 2014-06-28: 1 mg via INTRAVENOUS
  Filled 2014-06-28: qty 1

## 2014-06-28 NOTE — ED Notes (Signed)
PA Heather at bedside making patient aware of elevated D-dimer and order for CT.

## 2014-06-28 NOTE — ED Notes (Signed)
Pt transferred to XRAY 

## 2014-06-28 NOTE — ED Notes (Signed)
PA at bedside.

## 2014-06-28 NOTE — ED Notes (Signed)
Pt reports that pain only went away for 2 minutes and has returned. This RN explained to patient to give the medication a little longer. Will continue to monitor.

## 2014-06-28 NOTE — ED Notes (Signed)
Pt states she woke up this morning about 330 to go to the bathroom and started having chest pain to the middle of her chest  Pt describes it as a heaviness  Pt states she has had some light headedness and some nausea  Pt states she took a xanax about 515 without any relief

## 2014-06-28 NOTE — ED Notes (Signed)
Patient transported to CT 

## 2014-06-28 NOTE — ED Provider Notes (Signed)
CSN: [id number]     Arrival date & time 06/28/14  0604 History   First MD Initiated Contact with Patient 06/28/14 [phone]     Chief Complaint  Patient presents with  . Chest Pain     (Consider location/radiation/quality/duration/timing/severity/associated sxs/prior Treatment) HPI Comments: Patient presents today with a chief complaint of chest pain.  She states that the pain has been constant since 3:30 AM this morning when she got up to go to the bathroom.  She describes the pain as a heaviness and a pressure.  Pain is substernal and non radiating.  Pain not associated with exertion or eating.  Pain is worse with palpation.  She reports some mild associated nausea, but denies vomiting.  She also reports mild associated dizziness, but no syncope.  She denies SOB, cough, fever, numbness, tingling, or diaphoresis.  She states that she took Xanax around 5:15 AM this morning, but does not feel that it helped.  She denies any prior cardiac history.  Denies history of HTN, Hyperlipidemia, or DM.  She does not smoke and never has.  She denies any family history of cardiac disease.  She denies any history of DVT or PE.  No prolonged travel or surgeries in the past 4 weeks.  No hemoptysis.  No LE edema or pain.  She is on Estrace hormone replacement therapy.  Patient is a 54 y.o. female presenting with chest pain.  Chest Pain   Past Medical History  Diagnosis Date  . Ulcer   . Arthritis   . DDD (degenerative disc disease)   . Depression with anxiety     r/t death of mother  . Shortness of breath     JUST WHEN I MOVE - RELATED TO MY BACK PAIN  . Insomnia     takes Xanax nightly as needed as well as Ambien  . Depression     takes Wellbutrin daily  . Constipation     takes Colace daily as needed  . Muscle spasm     takes Robaxin daily as needed  . History of bronchitis yrs ago  . Headache(784.0)     occasionally  . Dizziness   . Weakness     numbness and tingling in right leg  . Back pain      herniated disc  . Vitamin D deficiency     takes Vit D weekly  . GERD (gastroesophageal reflux disease)     was taking Prilosec but has been off 2-3 wks  . Urinary frequency   . Nocturia   . Anxiety     takes Xanax as needed   Past Surgical History  Procedure Laterality Date  . Ovarian cyst surgery    . Shoulder arthroscopy with rotator cuff repair Right   . Lumbar laminectomy/decompression microdiscectomy N/A 08/26/2013    Procedure: MICRO LUMBER DECOMPRESSION L5-S1 BILATERAL;  Surgeon: Javier Docker, MD;  Location: [facility];  Service: Orthopedics;  Laterality: N/A;  . Exploratory laparotomy    . Colonoscopy    . Lumbar disc surgery  10/14/2013    revision L5-S1  . Back surgery    . Tubal ligation  1980's  . Abdominal hysterectomy    . Lumbar laminectomy/decompression microdiscectomy Right 10/14/2013    Procedure: REVISION RIGHT L5-S1 DISCECTOMY ;  Surgeon: Venita Lick, MD;  Location: [facility];  Service: Orthopedics;  Laterality: Right;   Family History  Problem Relation Age of Onset  . COPD Father    History  Substance Use Topics  .  Smoking status: Never Smoker   . Smokeless tobacco: Never Used  . Alcohol Use: No   OB History    No data available     Review of Systems  Cardiovascular: Positive for chest pain.  All other systems reviewed and are negative.     Allergies  Review of patient's allergies indicates no known allergies.  Home Medications   Prior to Admission medications   Medication Sig Start Date End Date Taking? Authorizing Provider  alprazolam Prudy Feeler) 2 MG tablet Take 2 mg by mouth 2 (two) times daily as needed for sleep.     Historical Provider, MD  docusate sodium (COLACE) 100 MG capsule Take 1 capsule (100 mg total) by mouth 2 (two) times daily as needed for mild constipation. Patient not taking: Reported on 05/03/2014 08/26/13   Jene Every, MD  escitalopram (LEXAPRO) 10 MG tablet Take 10 mg by mouth daily.     Historical Provider, MD   esomeprazole (NEXIUM) 20 MG capsule Take 20 mg by mouth daily as needed (For heartburn or acid reflux.).    Historical Provider, MD  estradiol (ESTRACE) 0.5 MG tablet Take 0.5 mg by mouth 2 (two) times daily.     Historical Provider, MD  methocarbamol (ROBAXIN) 500 MG tablet Take 1 tablet (500 mg total) by mouth every 8 (eight) hours as needed for muscle spasms. 05/03/14   Linwood Dibbles, MD  nefazodone (SERZONE) 100 MG tablet Take 100 mg by mouth at bedtime.  04/15/14   Historical Provider, MD  ondansetron (ZOFRAN ODT) 4 MG disintegrating tablet Take 1 tablet (4 mg total) by mouth every 8 (eight) hours as needed. Patient not taking: Reported on 05/03/2014 10/15/13   Naida Sleight, PA-C  oxyCODONE (OXY IR/ROXICODONE) 5 MG immediate release tablet Take 1-2 tablets (5-10 mg total) by mouth every 6 (six) hours as needed for severe pain. 05/03/14   Linwood Dibbles, MD  polyethylene glycol [facility] / Ethelene Hal) packet Take 17 g by mouth daily. Patient not taking: Reported on 05/03/2014 10/15/13   Naida Sleight, PA-C  zolpidem (AMBIEN) 10 MG tablet Take 10 mg by mouth at bedtime.  08/06/13   Historical Provider, MD   BP 144/93 mmHg  Pulse 84  Temp(Src) 98.3 F (36.8 C) (Oral)  Resp 14  SpO2 100% Physical Exam  Constitutional: She appears well-developed and well-nourished.  HENT:  Head: Normocephalic and atraumatic.  Mouth/Throat: Oropharynx is clear and moist.  Neck: Normal range of motion. Neck supple.  Cardiovascular: Normal rate, regular rhythm and normal heart sounds.   Pulmonary/Chest: Effort normal and breath sounds normal. No respiratory distress. She has no wheezes. She has no rales. She exhibits tenderness.  Abdominal: Soft. Bowel sounds are normal. She exhibits no distension and no mass. There is no tenderness. There is no rebound and no guarding.  Musculoskeletal: Normal range of motion.  No LE edema or erythema Negative Homan's sign bilaterally  Neurological: She is alert.  Skin: Skin is warm and dry.  She is not diaphoretic.  Psychiatric: Her mood appears anxious.  Nursing note and vitals reviewed.   ED Course  Procedures (including critical care time) Labs Review Labs Reviewed  CBC  BASIC METABOLIC PANEL  Rosezena Sensor, ED    Imaging Review Dg Chest 2 View  06/28/2014   CLINICAL DATA:  Severe mid chest pain.  EXAM: CHEST  2 VIEW  COMPARISON:  Chest CT dated 09/23/2013  FINDINGS: The heart size and mediastinal contours are within normal limits. Both lungs are clear.  The visualized skeletal structures are unremarkable.  IMPRESSION: Normal chest.   Electronically Signed   By: Francene Boyers M.D.   On: 06/28/2014 07:06   Ct Angio Chest Pe W/cm &/or Wo Cm  06/28/2014   CLINICAL DATA:  Chest pain and shortness of Breath  EXAM: CT ANGIOGRAPHY CHEST WITH CONTRAST  TECHNIQUE: Multidetector CT imaging of the chest was performed using the standard protocol during bolus administration of intravenous contrast. Multiplanar CT image reconstructions and MIPs were obtained to evaluate the vascular anatomy.  CONTRAST:  OMNIPAQUE IOHEXOL 350 MG/ML SOLN  COMPARISON:  09/23/2013  FINDINGS: The lungs are well aerated bilaterally with some mild dependent changes. No focal infiltrate or sizable effusion is seen.  The thoracic inlet is within normal limits. The thoracic aorta and its branches are within normal limits without aneurysmal dilatation or dissection. Pulmonary artery is well visualized and demonstrates a normal branching pattern. No filling defects to suggest pulmonary emboli are identified. No hilar or mediastinal adenopathy is seen. No significant coronary calcifications are noted.  The visualized upper abdomen is within normal limits. No acute bony abnormality is seen. Mild degenerative change of the thoracic spine is noted.  Review of the MIP images confirms the above findings.  IMPRESSION: No evidence of pulmonary emboli.  No acute abnormality noted.   Electronically Signed   By: Alcide Clever  M.D.   On: 06/28/2014 07:39     EKG Interpretation   Date/Time:  Monday June 28 2014 06:13:31 EDT Ventricular Rate:  83 PR Interval:  164 QRS Duration: 98 QT Interval:  391 QTC Calculation: 459 R Axis:   76 Text Interpretation:  Sinus rhythm Low voltage, precordial leads  Borderline T abnormalities, anterior leads Baseline wander in lead(s) II  III aVR aVL aVF V1 V2 V3 V4 V5 V6 No significant change since last tracing  Confirmed by Mirian Mo [phone]) on 06/28/2014 6:30:02 AM      MDM   Final diagnoses:  None   Patient presents today with atypical, reproducible chest pain that has been constant since 3 AM this morning.  No exertional component to the pain.  No ischemic changes on EKG.  Initial and delta troponin negative.  CXR negative.  D-dimer was found to be elevated.  Therefore, CT angio chest ordered to rule out PE, which was also negative.  Patient with a HEART score of 1 making her low risk.  Pain improved significantly in the ED.  Feel that the patient is stable for discharge.  Return precautions given.  Patient discussed with Dr. Littie Deeds who is in agreement with the plan.    Santiago Glad, PA-C 06/29/14 1115  Mirian Mo, MD 07/01/14 [phone]

## 2014-07-05 ENCOUNTER — Other Ambulatory Visit: Payer: Self-pay

## 2014-07-05 DIAGNOSIS — Z1231 Encounter for screening mammogram for malignant neoplasm of breast: Secondary | ICD-10-CM

## 2014-07-13 ENCOUNTER — Ambulatory Visit
Admission: RE | Admit: 2014-07-13 | Discharge: 2014-07-13 | Disposition: A | Discharge status: Home / Self Care | Payer: BLUE CROSS/BLUE SHIELD | Source: Ambulatory Visit

## 2014-07-13 DIAGNOSIS — Z1231 Encounter for screening mammogram for malignant neoplasm of breast: Secondary | ICD-10-CM

## 2014-07-17 ENCOUNTER — Emergency Department (HOSPITAL_COMMUNITY)
Admission: EM | Admit: 2014-07-17 | Discharge: 2014-07-17 | Disposition: A | Discharge status: Home / Self Care | Payer: BLUE CROSS/BLUE SHIELD | Attending: Emergency Medicine | Admitting: Emergency Medicine

## 2014-07-17 ENCOUNTER — Encounter (HOSPITAL_COMMUNITY): Payer: Self-pay | Admitting: Emergency Medicine

## 2014-07-17 DIAGNOSIS — Z79899 Other long term (current) drug therapy: Secondary | ICD-10-CM | POA: Diagnosis not present

## 2014-07-17 DIAGNOSIS — F329 Major depressive disorder, single episode, unspecified: Secondary | ICD-10-CM | POA: Insufficient documentation

## 2014-07-17 DIAGNOSIS — M545 Low back pain, unspecified: Secondary | ICD-10-CM

## 2014-07-17 DIAGNOSIS — E559 Vitamin D deficiency, unspecified: Secondary | ICD-10-CM | POA: Insufficient documentation

## 2014-07-17 DIAGNOSIS — Z9889 Other specified postprocedural states: Secondary | ICD-10-CM | POA: Diagnosis not present

## 2014-07-17 DIAGNOSIS — K5900 Constipation, unspecified: Secondary | ICD-10-CM | POA: Insufficient documentation

## 2014-07-17 DIAGNOSIS — F418 Other specified anxiety disorders: Secondary | ICD-10-CM | POA: Diagnosis not present

## 2014-07-17 DIAGNOSIS — G4700 Insomnia, unspecified: Secondary | ICD-10-CM | POA: Diagnosis not present

## 2014-07-17 DIAGNOSIS — K219 Gastro-esophageal reflux disease without esophagitis: Secondary | ICD-10-CM | POA: Diagnosis not present

## 2014-07-17 DIAGNOSIS — M1990 Unspecified osteoarthritis, unspecified site: Secondary | ICD-10-CM | POA: Insufficient documentation

## 2014-07-17 DIAGNOSIS — G8929 Other chronic pain: Secondary | ICD-10-CM | POA: Insufficient documentation

## 2014-07-17 DIAGNOSIS — Z793 Long term (current) use of hormonal contraceptives: Secondary | ICD-10-CM | POA: Insufficient documentation

## 2014-07-17 DIAGNOSIS — R110 Nausea: Secondary | ICD-10-CM | POA: Diagnosis not present

## 2014-07-17 MED ORDER — NAPROXEN 500 MG PO TABS: 500.0000 mg | ORAL_TABLET | Freq: Two times a day (BID) | ORAL | Status: DC

## 2014-07-17 MED ORDER — OXYCODONE HCL 5 MG PO TABS: 5.0000 mg | ORAL_TABLET | Freq: Four times a day (QID) | ORAL | Status: DC | PRN

## 2014-07-17 MED ORDER — HYDROMORPHONE HCL 1 MG/ML IJ SOLN
1.0000 mg | Freq: Once | INTRAMUSCULAR | Status: AC
  Administered 2014-07-17: 1 mg via INTRAMUSCULAR
  Filled 2014-07-17: qty 1

## 2014-07-17 MED ORDER — HYDROMORPHONE HCL 1 MG/ML IJ SOLN
1.0000 mg | Freq: Once | INTRAMUSCULAR | Status: DC
Start: 2014-07-17 — End: 2014-07-17

## 2014-07-17 MED ORDER — KETOROLAC TROMETHAMINE 60 MG/2ML IM SOLN
60.0000 mg | Freq: Once | INTRAMUSCULAR | Status: AC
  Administered 2014-07-17: 60 mg via INTRAMUSCULAR
  Filled 2014-07-17: qty 2
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
## 2014-07-17 NOTE — Discharge Instructions (Signed)

## 2014-07-26 ENCOUNTER — Encounter (HOSPITAL_COMMUNITY): Payer: Self-pay | Admitting: Emergency Medicine

## 2014-07-26 ENCOUNTER — Emergency Department (HOSPITAL_COMMUNITY): Payer: BLUE CROSS/BLUE SHIELD

## 2014-07-26 ENCOUNTER — Emergency Department (HOSPITAL_COMMUNITY)
Admission: EM | Admit: 2014-07-26 | Discharge: 2014-07-26 | Disposition: A | Discharge status: Home / Self Care | Payer: BLUE CROSS/BLUE SHIELD | Attending: Emergency Medicine | Admitting: Emergency Medicine

## 2014-07-26 DIAGNOSIS — Z79899 Other long term (current) drug therapy: Secondary | ICD-10-CM | POA: Diagnosis not present

## 2014-07-26 DIAGNOSIS — Z791 Long term (current) use of non-steroidal anti-inflammatories (NSAID): Secondary | ICD-10-CM | POA: Diagnosis not present

## 2014-07-26 DIAGNOSIS — S199XXA Unspecified injury of neck, initial encounter: Secondary | ICD-10-CM | POA: Diagnosis present

## 2014-07-26 DIAGNOSIS — W19XXXA Unspecified fall, initial encounter: Secondary | ICD-10-CM

## 2014-07-26 DIAGNOSIS — F418 Other specified anxiety disorders: Secondary | ICD-10-CM | POA: Diagnosis not present

## 2014-07-26 DIAGNOSIS — G4700 Insomnia, unspecified: Secondary | ICD-10-CM | POA: Diagnosis not present

## 2014-07-26 DIAGNOSIS — Y998 Other external cause status: Secondary | ICD-10-CM | POA: Diagnosis not present

## 2014-07-26 DIAGNOSIS — S0990XA Unspecified injury of head, initial encounter: Secondary | ICD-10-CM | POA: Diagnosis not present

## 2014-07-26 DIAGNOSIS — K219 Gastro-esophageal reflux disease without esophagitis: Secondary | ICD-10-CM | POA: Diagnosis not present

## 2014-07-26 DIAGNOSIS — S29092A Other injury of muscle and tendon of back wall of thorax, initial encounter: Secondary | ICD-10-CM | POA: Insufficient documentation

## 2014-07-26 DIAGNOSIS — S3992XA Unspecified injury of lower back, initial encounter: Secondary | ICD-10-CM | POA: Insufficient documentation

## 2014-07-26 DIAGNOSIS — K5900 Constipation, unspecified: Secondary | ICD-10-CM | POA: Insufficient documentation

## 2014-07-26 DIAGNOSIS — M545 Low back pain, unspecified: Secondary | ICD-10-CM

## 2014-07-26 DIAGNOSIS — F329 Major depressive disorder, single episode, unspecified: Secondary | ICD-10-CM | POA: Insufficient documentation

## 2014-07-26 DIAGNOSIS — S161XXA Strain of muscle, fascia and tendon at neck level, initial encounter: Secondary | ICD-10-CM

## 2014-07-26 DIAGNOSIS — M1990 Unspecified osteoarthritis, unspecified site: Secondary | ICD-10-CM | POA: Diagnosis not present

## 2014-07-26 DIAGNOSIS — M546 Pain in thoracic spine: Secondary | ICD-10-CM

## 2014-07-26 DIAGNOSIS — W01198A Fall on same level from slipping, tripping and stumbling with subsequent striking against other object, initial encounter: Secondary | ICD-10-CM | POA: Insufficient documentation

## 2014-07-26 DIAGNOSIS — Y9301 Activity, walking, marching and hiking: Secondary | ICD-10-CM | POA: Diagnosis not present

## 2014-07-26 DIAGNOSIS — Y92009 Unspecified place in unspecified non-institutional (private) residence as the place of occurrence of the external cause: Secondary | ICD-10-CM | POA: Insufficient documentation

## 2014-07-26 DIAGNOSIS — Z8709 Personal history of other diseases of the respiratory system: Secondary | ICD-10-CM | POA: Insufficient documentation

## 2014-07-26 DIAGNOSIS — E559 Vitamin D deficiency, unspecified: Secondary | ICD-10-CM | POA: Diagnosis not present

## 2014-07-26 DIAGNOSIS — E108 Type 1 diabetes mellitus with unspecified complications: Secondary | ICD-10-CM | POA: Diagnosis not present

## 2014-07-26 MED ORDER — OXYCODONE HCL 5 MG PO TABS
10.0000 mg | ORAL_TABLET | Freq: Once | ORAL | Status: AC
  Administered 2014-07-26: 10 mg via ORAL
  Filled 2014-07-26: qty 2

## 2014-07-26 MED ORDER — KETOROLAC TROMETHAMINE 60 MG/2ML IM SOLN
60.0000 mg | Freq: Once | INTRAMUSCULAR | Status: AC
  Administered 2014-07-26: 60 mg via INTRAMUSCULAR
  Filled 2014-07-26: qty 2

## 2014-07-26 MED ORDER — ONDANSETRON HCL 4 MG PO TABS: 4.0000 mg | ORAL_TABLET | Freq: Three times a day (TID) | ORAL | Status: DC | PRN

## 2014-07-26 MED ORDER — ONDANSETRON 4 MG PO TBDP
4.0000 mg | ORAL_TABLET | Freq: Once | ORAL | Status: AC
  Administered 2014-07-26: 4 mg via ORAL
  Filled 2014-07-26: qty 1

## 2014-07-26 MED ORDER — OXYCODONE HCL 5 MG PO TABS: 5.0000 mg | ORAL_TABLET | Freq: Four times a day (QID) | ORAL | Status: DC | PRN

## 2014-07-26 MED ORDER — OXYCODONE-ACETAMINOPHEN 5-325 MG PO TABS
1.0000 | ORAL_TABLET | Freq: Once | ORAL | Status: AC
  Administered 2014-07-26: 1 via ORAL
  Filled 2014-07-26: qty 1

## 2014-07-26 MED ORDER — CYCLOBENZAPRINE HCL 10 MG PO TABS: 10.0000 mg | ORAL_TABLET | Freq: Two times a day (BID) | ORAL | Status: DC | PRN

## 2014-07-26 MED ORDER — HYDROMORPHONE HCL 2 MG/ML IJ SOLN
2.0000 mg | Freq: Once | INTRAMUSCULAR | Status: AC
  Administered 2014-07-26: 2 mg via INTRAMUSCULAR
  Filled 2014-07-26: qty 1

## 2014-07-26 NOTE — Discharge Instructions (Signed)
Head Injury °You have received a head injury. It does not appear serious at this time. Headaches and vomiting are common following head injury. It should be easy to awaken from sleeping. Sometimes it is necessary for you to stay in the emergency department for a while for observation. Sometimes admission to the hospital may be needed. After injuries such as yours, most problems occur within the first 24 hours, but side effects may occur up to 7-10 days after the injury. It is important for you to carefully monitor your condition and contact your health care provider or seek immediate medical care if there is a change in your condition. °WHAT ARE THE TYPES OF HEAD INJURIES? °Head injuries can be as minor as a bump. Some head injuries can be more severe. More severe head injuries include: °· A jarring injury to the brain (concussion). °· A bruise of the brain (contusion). This mean there is bleeding in the brain that can cause swelling. °· A cracked skull (skull fracture). °· Bleeding in the brain that collects, clots, and forms a bump (hematoma). °WHAT CAUSES A HEAD INJURY? °A serious head injury is most likely to happen to someone who is in a car wreck and is not wearing a seat belt. Other causes of major head injuries include bicycle or motorcycle accidents, sports injuries, and falls. °HOW ARE HEAD INJURIES DIAGNOSED? °A complete history of the event leading to the injury and your current symptoms will be helpful in diagnosing head injuries. Many times, pictures of the brain, such as CT or MRI are needed to see the extent of the injury. Often, an overnight hospital stay is necessary for observation.  °WHEN SHOULD I SEEK IMMEDIATE MEDICAL CARE?  °You should get help right away if: °· You have confusion or drowsiness. °· You feel sick to your stomach (nauseous) or have continued, forceful vomiting. °· You have dizziness or unsteadiness that is getting worse. °· You have severe, continued headaches not relieved by  medicine. Only take over-the-counter or prescription medicines for pain, fever, or discomfort as directed by your health care provider. °· You do not have normal function of the arms or legs or are unable to walk. °· You notice changes in the black spots in the center of the colored part of your eye (pupil). °· You have a clear or bloody fluid coming from your nose or ears. °· You have a loss of vision. °During the next 24 hours after the injury, you must stay with someone who can watch you for the warning signs. This person should contact local emergency services (911 in the [place] if you have seizures, you become unconscious, or you are unable to wake up. °HOW CAN I PREVENT A HEAD INJURY IN THE FUTURE? °The most important factor for preventing major head injuries is avoiding motor vehicle accidents.  To minimize the potential for damage to your head, it is crucial to wear seat belts while riding in motor vehicles. Wearing helmets while bike riding and playing collision sports (like football) is also helpful. Also, avoiding dangerous activities around the house will further help reduce your risk of head injury.  °WHEN CAN I RETURN TO NORMAL ACTIVITIES AND ATHLETICS? °You should be reevaluated by your health care provider before returning to these activities. If you have any of the following symptoms, you should not return to activities or contact sports until 1 week after the symptoms have stopped: °· Persistent headache. °· Dizziness or vertigo. °· Poor attention and concentration. °· Confusion. °·   Memory problems. °· Nausea or vomiting. °· Fatigue or tire easily. °· Irritability. °· Intolerant of bright lights or loud noises. °· Anxiety or depression. °· Disturbed sleep. °MAKE SURE YOU:  °· Understand these instructions. °· Will watch your condition. °· Will get help right away if you are not doing well or get worse. °Document Released: 02/26/2005 Document Revised: 03/03/2013 Document Reviewed:  11/03/2012 °[employer]® Patient Information ©2015 [employer]. This information is not intended to replace advice given to you by your health care provider. Make sure you discuss any questions you have with your health care provider. ° ° °Back Pain, Adult °Low back pain is very common. About 1 in 5 people have back pain. The cause of low back pain is rarely dangerous. The pain often gets better over time. About half of people with a sudden onset of back pain feel better in just 2 weeks. About 8 in 10 people feel better by 6 weeks.  °CAUSES °Some common causes of back pain include: °· Strain of the muscles or ligaments supporting the spine. °· Wear and tear (degeneration) of the spinal discs. °· Arthritis. °· Direct injury to the back. °DIAGNOSIS °Most of the time, the direct cause of low back pain is not known. However, back pain can be treated effectively even when the exact cause of the pain is unknown. Answering your caregiver's questions about your overall health and symptoms is one of the most accurate ways to make sure the cause of your pain is not dangerous. If your caregiver needs more information, he or she may order lab work or imaging tests (X-rays or MRIs). However, even if imaging tests show changes in your back, this usually does not require surgery. °HOME CARE INSTRUCTIONS °For many people, back pain returns. Since low back pain is rarely dangerous, it is often a condition that people can learn to manage on their own.  °· Remain active. It is stressful on the back to sit or stand in one place. Do not sit, drive, or stand in one place for more than 30 minutes at a time. Take short walks on level surfaces as soon as pain allows. Try to increase the length of time you walk each day. °· Do not stay in bed. Resting more than 1 or 2 days can delay your recovery. °· Do not avoid exercise or work. Your body is made to move. It is not dangerous to be active, even though your back may hurt. Your back will  likely heal faster if you return to being active before your pain is gone. °· Pay attention to your body when you  bend and lift. Many people have less discomfort when lifting if they bend their knees, keep the load close to their bodies, and avoid twisting. Often, the most comfortable positions are those that put less stress on your recovering back. °· Find a comfortable position to sleep. Use a firm mattress and lie on your side with your knees slightly bent. If you lie on your back, put a pillow under your knees. °· Only take over-the-counter or prescription medicines as directed by your caregiver. Over-the-counter medicines to reduce pain and inflammation are often the most helpful. Your caregiver may prescribe muscle relaxant drugs. These medicines help dull your pain so you can more quickly return to your normal activities and healthy exercise. °· Put ice on the injured area. °· Put ice in a plastic bag. °· Place a towel between your skin and the bag. °· Leave the ice on for 15-20 minutes, 03-04 times a day for the first 2 to 3 days. After   that, ice and heat may be alternated to reduce pain and spasms. °· Ask your caregiver about trying back exercises and gentle massage. This may be of some benefit. °· Avoid feeling anxious or stressed. Stress increases muscle tension and can worsen back pain. It is important to recognize when you are anxious or stressed and learn ways to manage it. Exercise is a great option. °SEEK MEDICAL CARE IF: °· You have pain that is not relieved with rest or medicine. °· You have pain that does not improve in 1 week. °· You have new symptoms. °· You are generally not feeling well. °SEEK IMMEDIATE MEDICAL CARE IF:  °· You have pain that radiates from your back into your legs. °· You develop new bowel or bladder control problems. °· You have unusual weakness or numbness in your arms or legs. °· You develop nausea or vomiting. °· You develop abdominal pain. °· You feel faint. °Document  Released: 02/26/2005 Document Revised: 08/28/2011 Document Reviewed: 06/30/2013 °[employer]® Patient Information ©2015 [employer]. This information is not intended to replace advice given to you by your health care provider. Make sure you discuss any questions you have with your health care provider. ° ° ° ° °Fall Prevention and Home Safety °Falls cause injuries and can affect all age groups. It is possible to use preventive measures to significantly decrease the likelihood of falls. There are many simple measures which can make your home safer and prevent falls. °OUTDOORS °· Repair cracks and edges of walkways and driveways. °· Remove high doorway thresholds. °· Trim shrubbery on the main path into your home. °· Have good outside lighting. °· Clear walkways of tools, rocks, debris, and clutter. °· Check that handrails are not broken and are securely fastened. Both sides of steps should have handrails. °· Have leaves, snow, and ice cleared regularly. °· Use sand or salt on walkways during winter months. °· In the garage, clean up grease or oil spills. °BATHROOM °· Install night lights. °· Install grab bars by the toilet and in the tub and shower. °· Use non-skid mats or decals in the tub or shower. °· Place a plastic non-slip stool in the shower to sit on, if needed. °· Keep floors dry and clean up all water on the floor immediately. °· Remove soap buildup in the tub or shower on a regular basis. °· Secure bath mats with non-slip, double-sided rug tape. °· Remove throw rugs and tripping hazards from the floors. °BEDROOMS °· Install night lights. °· Make sure a bedside light is easy to reach. °· Do not use oversized bedding. °· Keep a telephone by your bedside. °· Have a firm chair with side arms to use for getting dressed. °· Remove throw rugs and tripping hazards from the floor. °KITCHEN °· Keep handles on pots and pans turned toward the center of the stove. Use back burners when possible. °· Clean up spills  quickly and allow time for drying. °· Avoid walking on wet floors. °· Avoid hot utensils and knives. °· Position shelves so they are not too high or low. °· Place commonly used objects within easy reach. °· If necessary, use a sturdy step stool with a grab bar when reaching. °· Keep electrical cables out of the way. °· Do not use floor polish or wax that makes floors slippery. If you must use wax, use non-skid floor wax. °· Remove throw rugs and tripping hazards from the floor. °STAIRWAYS °· Never leave objects on stairs. °· Place handrails on both sides of   stairways and use them. Fix any loose handrails. Make sure handrails on both sides of the stairways are as long as the stairs. °· Check carpeting to make sure it is firmly attached along stairs. Make repairs to worn or loose carpet promptly. °· Avoid placing throw rugs at the top or bottom of stairways, or properly secure the rug with carpet tape to prevent slippage. Get rid of throw rugs, if possible. °· Have an electrician put in a light switch at the top and bottom of the stairs. °OTHER FALL PREVENTION TIPS °· Wear low-heel or rubber-soled shoes that are supportive and fit well. Wear closed toe shoes. °· When using a stepladder, make sure it is fully opened and both spreaders are firmly locked. Do not climb a closed stepladder. °· Add color or contrast paint or tape to grab bars and handrails in your home. Place contrasting color strips on first and last steps. °· Learn and use mobility aids as needed. Install an electrical emergency response system. °· Turn on lights to avoid dark areas. Replace light bulbs that burn out immediately. Get light switches that glow. °· Arrange furniture to create clear pathways. Keep furniture in the same place. °· Firmly attach carpet with non-skid or double-sided tape. °· Eliminate uneven floor surfaces. °· Select a carpet pattern that does not visually hide the edge of steps. °· Be aware of all pets. °OTHER HOME SAFETY  TIPS °· Set the water temperature for 120° F (48.8° C). °· Keep emergency numbers on or near the telephone. °· Keep smoke detectors on every level of the home and near sleeping areas. °Document Released: 02/16/2002 Document Revised: 08/28/2011 Document Reviewed: 05/18/2011 °[employer]® Patient Information ©2015 [employer]. This information is not intended to replace advice given to you by your health care provider. Make sure you discuss any questions you have with your health care provider. ° °

## 2014-07-26 NOTE — ED Notes (Signed)
Patient transported to X-ray 

## 2014-07-26 NOTE — ED Notes (Signed)
MD at bedside. 

## 2014-07-26 NOTE — ED Notes (Signed)
Per EMS pt slipped when walking down hard wood steps. Fell down 2 steps. Pt complains of neck and back pain. Hx of lower back surgery. No LOC.
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
## 2014-07-26 NOTE — ED Notes (Signed)
Bed: AF79 Expected date:  Expected time:  Means of arrival:  Comments: EMS - Fall, neck/back pain

## 2014-08-04 NOTE — Progress Notes (Signed)
Patient ID: [name], female   DOB: 09-26-1960, 54 y.o.   MRN: [id number]     Cardiology Office Note   Date:  08/06/2014   ID:  [name], DOB [birth year], MRN [id number]  PCP:  Gweneth Dimitri, MD  Cardiologist:   Charlton Haws, MD   No chief complaint on file.     History of Present Illness: [name] is a 54 y.o. female who presents for  Evaluation of chest pain  Seen in ER 06/28/14  R/O CXR normal CT negative for PE Troponin negative x 2 All records reviewed  D/C home with diagnosis non cardiac chest pain  She stated that the pain has been constant since 3:30 AM in  morning when she got up to go to the bathroom. She describes the pain as a heaviness and a pressure. Pain is substernal and non radiating. Pain not associated with exertion or eating. Pain is worse with palpation. She reports some mild associated nausea, but denies vomiting. She also reports mild associated dizziness, but no syncope. She denies SOB, cough, fever, numbness, tingling, or diaphoresis. She states that she took Xanax around 5:15 AM that  morning, but does not feel that it helped. She denies any prior cardiac history. Denies history of HTN, Hyperlipidemia, or DM. She does not smoke and never has. She denies any family history of cardiac disease. She denies any history of DVT or PE. No prolonged travel or surgeries in the past 4 weeks. No hemoptysis. No LE edema or pain. She is on Estrace hormone replacement therapy.  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 Ulcer   . Arthritis   . DDD (degenerative disc disease)   . Depression with anxiety     r/t death of mother  . Shortness of breath     JUST WHEN I MOVE - RELATED TO MY BACK PAIN  . Insomnia     takes Xanax nightly as needed as well as  Ambien  . Depression     takes Wellbutrin daily  . Constipation     takes Colace daily as needed  . Muscle spasm     takes Robaxin daily as needed  . History of bronchitis yrs ago  . Headache(784.0)     occasionally  . Dizziness   . Weakness     numbness and tingling in right leg  . Back pain     herniated disc  . Vitamin D deficiency     takes Vit D weekly  . GERD (gastroesophageal reflux disease)     was taking Prilosec but has been off 2-3 wks  . Urinary frequency   . Nocturia   . Anxiety     takes Xanax as needed    Past Surgical History  Procedure Laterality Date  . Ovarian cyst surgery    . Shoulder arthroscopy with rotator cuff repair Right   . Lumbar laminectomy/decompression microdiscectomy N/A 08/26/2013    Procedure: MICRO LUMBER DECOMPRESSION L5-S1 BILATERAL;  Surgeon: Javier Docker, MD;  Location: [facility];  Service: Orthopedics;  Laterality: N/A;  . Exploratory laparotomy    . Colonoscopy    . Lumbar disc surgery  10/14/2013    revision L5-S1  . Back surgery    . Tubal  ligation  1980's  . Abdominal hysterectomy    . Lumbar laminectomy/decompression microdiscectomy Right 10/14/2013    Procedure: REVISION RIGHT L5-S1 DISCECTOMY ;  Surgeon: Venita Lick, MD;  Location: [facility];  Service: Orthopedics;  Laterality: Right;     Current Outpatient Prescriptions  Medication Sig Dispense Refill  . alprazolam (XANAX) 2 MG tablet Take 2 mg by mouth 2 (two) times daily as needed for anxiety.     . bismuth subsalicylate (PEPTO BISMOL) 262 MG chewable tablet Chew 524 mg by mouth 4 (four) times daily -  before meals and at bedtime.    Marland Kitchen estradiol (ESTRACE) 0.5 MG tablet Take 0.5 mg by mouth 2 (two) times daily.     . methocarbamol (ROBAXIN) 500 MG tablet Take 1 tablet (500 mg total) by mouth every 8 (eight) hours as needed for muscle spasms. 60 tablet 1  . ondansetron (ZOFRAN) 4 MG tablet Take 1 tablet (4 mg total) by mouth every 8 (eight) hours as needed for nausea or  vomiting. 15 tablet 0  . oxyCODONE (ROXICODONE) 5 MG immediate release tablet Take 1 tablet (5 mg total) by mouth every 6 (six) hours as needed for severe pain. 15 tablet 0  . solifenacin (VESICARE) 5 MG tablet Take 5 mg by mouth daily.    . Vitamin D, Ergocalciferol, (DRISDOL) 50000 UNITS CAPS capsule Take 50,000 Units by mouth as directed. Monday & Thursday only    . zolpidem (AMBIEN) 10 MG tablet Take 10 mg by mouth at bedtime.      No current facility-administered medications for this visit.    Allergies:   Review of patient's allergies indicates no known allergies.    Social History:  The patient  reports that she has never smoked. She has never used smokeless tobacco. She reports that she does not drink alcohol or use illicit drugs.   Family History:  The patient's family history includes COPD in her father; Transient ischemic attack in her mother.    ROS:  Please see the history of present illness.   Otherwise, review of systems are positive for none.   All other systems are reviewed and negative.    PHYSICAL EXAM: VS:  BP 122/70 mmHg  Pulse 91  Ht 5\' 5"  (1.651 m)  Wt 82.101 kg (181 lb)  BMI 30.12 kg/m2  SpO2 96% , BMI Body mass index is 30.12 kg/(m^2). Affect appropriate Healthy:  appears stated age HEENT: normal Neck supple with no adenopathy JVP normal no bruits no thyromegaly Lungs clear with no wheezing and good diaphragmatic motion Heart:  S1/S2 no murmur, no rub, gallop or click PMI normal Abdomen: benighn, BS positve, no tenderness, no AAA no bruit.  No HSM or HJR Distal pulses intact with no bruits No edema Neuro non-focal Skin warm and dry No muscular weakness    EKG:   4/19  SR rate 83 low voltage nonspecific ST/T wave changes wandering baseline    Recent Labs: 06/28/2014: BUN 14; Creatinine 0.67; Hemoglobin 13.1; Platelets 272; Potassium 3.7; Sodium 136    Lipid Panel No results found for: CHOL, TRIG, HDL, CHOLHDL, VLDL, LDLCALC, LDLDIRECT     Wt Readings from Last 3 Encounters:  08/06/14 82.101 kg (181 lb)  10/14/13 84.823 kg (187 lb)  02/12/13 72.576 kg (160 lb)      Other studies Reviewed: Additional studies/ records that were reviewed today include: ER records and last 2 years notes from Dr 14/04/14 .    ASSESSMENT AND PLAN:  1.  Chest Pain:  Atypical likely related to panic attacks Recurrent requiring ER visits  Abnormal ECG  F/u stress myovue 2. Anxiety F/U Dr Uvaldo Rising  ? Add SSRI to xanax 3.    Current medicines are reviewed at length with the patient today.  The patient does not have concerns regarding medicines.  The following changes have been made:  no change  Labs/ tests ordered today include:  Exercise Myovue   No orders of the defined types were placed in this encounter.     Disposition:   FU with me PRN      Signed, Charlton Haws, MD  08/06/2014 10:03 AM    [facility] [address] [place], [place], [place]  [address] Phone: [phone]; Fax: [phone]

## 2014-08-06 ENCOUNTER — Encounter: Payer: Self-pay | Admitting: Cardiovascular Disease

## 2014-08-06 ENCOUNTER — Ambulatory Visit (INDEPENDENT_AMBULATORY_CARE_PROVIDER_SITE_OTHER): Payer: BLUE CROSS/BLUE SHIELD | Admitting: Cardiovascular Disease

## 2014-08-06 VITALS — BP 122/70 | HR 91 | Ht 65.0 in | Wt 181.0 lb

## 2014-08-06 DIAGNOSIS — R9431 Abnormal electrocardiogram [ECG] [EKG]: Secondary | ICD-10-CM

## 2014-08-06 DIAGNOSIS — R0789 Other chest pain: Secondary | ICD-10-CM | POA: Diagnosis not present

## 2014-08-06 NOTE — Patient Instructions (Signed)
Medication Instructions:  NO CHANGES  Labwork: NONE  Testing/Procedures: Your physician has requested that you have en exercise stress myoview. For further information please visit [email or web address]. Please follow instruction sheet, as given.   Follow-Up: AS  NEEDED  Any Other Special Instructions Will Be Listed Below (If Applicable).   

## 2014-08-25 ENCOUNTER — Telehealth (HOSPITAL_COMMUNITY): Payer: Self-pay | Admitting: *Deleted

## 2014-08-25 NOTE — Telephone Encounter (Signed)
Patient given detailed instructions per Myocardial Perfusion Study Information Sheet for test on 08/27/14 at 7:45. Patient Notified to arrive 15 minutes early, and that it is imperative to arrive on time for appointment to keep from having the test rescheduled. Patient verbalized understanding. Antionette Char, RN

## 2014-08-26 ENCOUNTER — Other Ambulatory Visit: Payer: Self-pay | Admitting: Gastroenterology

## 2014-08-27 ENCOUNTER — Ambulatory Visit (HOSPITAL_COMMUNITY): Payer: BLUE CROSS/BLUE SHIELD | Attending: Cardiovascular Disease

## 2014-08-27 DIAGNOSIS — R9439 Abnormal result of other cardiovascular function study: Secondary | ICD-10-CM | POA: Diagnosis not present

## 2014-08-27 DIAGNOSIS — R0789 Other chest pain: Secondary | ICD-10-CM

## 2014-08-27 DIAGNOSIS — R9431 Abnormal electrocardiogram [ECG] [EKG]: Secondary | ICD-10-CM

## 2014-08-27 LAB — MYOCARDIAL PERFUSION IMAGING
CHL CUP NUCLEAR SDS: 0
CHL CUP NUCLEAR SSS: 1
CSEPED: 4 min
CSEPPHR: 151 {beats}/min
Estimated workload: 6.7 METS
Exercise duration (sec): 46 s
LHR: 0.33
LV dias vol: 62 mL
LV sys vol: 22 mL
MPHR: 164 {beats}/min
NUC STRESS EF: 64 %
Percent HR: 92 %
Rest HR: 76 {beats}/min
SRS: 1
TID: 1.01

## 2014-08-27 MED ORDER — TECHNETIUM TC 99M SESTAMIBI GENERIC - CARDIOLITE
11.0000 | Freq: Once | INTRAVENOUS | Status: AC | PRN
  Administered 2014-08-27: 11 via INTRAVENOUS

## 2014-08-27 MED ORDER — TECHNETIUM TC 99M SESTAMIBI GENERIC - CARDIOLITE
33.0000 | Freq: Once | INTRAVENOUS | Status: AC | PRN
  Administered 2014-08-27: 33 via INTRAVENOUS

## 2014-09-07 ENCOUNTER — Ambulatory Visit (HOSPITAL_COMMUNITY)
Admission: RE | Admit: 2014-09-07 | Payer: BLUE CROSS/BLUE SHIELD | Source: Ambulatory Visit | Admitting: Gastroenterology

## 2014-09-07 ENCOUNTER — Encounter (HOSPITAL_COMMUNITY): Admission: RE | Payer: Self-pay | Source: Ambulatory Visit

## 2014-09-07 SURGERY — COLONOSCOPY WITH PROPOFOL
Anesthesia: Monitor Anesthesia Care

## 2014-10-26 ENCOUNTER — Other Ambulatory Visit: Payer: Self-pay | Admitting: Gastroenterology

## 2014-11-01 ENCOUNTER — Encounter (HOSPITAL_COMMUNITY): Payer: Self-pay | Admitting: *Deleted

## 2014-11-04 ENCOUNTER — Ambulatory Visit (HOSPITAL_COMMUNITY): Payer: BLUE CROSS/BLUE SHIELD | Admitting: Anesthesiology

## 2014-11-04 ENCOUNTER — Encounter (HOSPITAL_COMMUNITY)
Admission: RE | Disposition: A | Discharge status: Home / Self Care | Payer: Self-pay | Source: Ambulatory Visit | Attending: Gastroenterology

## 2014-11-04 ENCOUNTER — Ambulatory Visit (HOSPITAL_COMMUNITY)
Admission: RE | Admit: 2014-11-04 | Discharge: 2014-11-04 | Disposition: A | Discharge status: Home / Self Care | Payer: BLUE CROSS/BLUE SHIELD | Source: Ambulatory Visit | Attending: Gastroenterology | Admitting: Gastroenterology

## 2014-11-04 ENCOUNTER — Encounter (HOSPITAL_COMMUNITY): Payer: Self-pay

## 2014-11-04 DIAGNOSIS — E780 Pure hypercholesterolemia: Secondary | ICD-10-CM | POA: Insufficient documentation

## 2014-11-04 DIAGNOSIS — R109 Unspecified abdominal pain: Secondary | ICD-10-CM | POA: Diagnosis not present

## 2014-11-04 DIAGNOSIS — M1990 Unspecified osteoarthritis, unspecified site: Secondary | ICD-10-CM | POA: Diagnosis not present

## 2014-11-04 DIAGNOSIS — R197 Diarrhea, unspecified: Secondary | ICD-10-CM | POA: Insufficient documentation

## 2014-11-04 DIAGNOSIS — K219 Gastro-esophageal reflux disease without esophagitis: Secondary | ICD-10-CM | POA: Insufficient documentation

## 2014-11-04 DIAGNOSIS — K589 Irritable bowel syndrome without diarrhea: Secondary | ICD-10-CM | POA: Diagnosis not present

## 2014-11-04 HISTORY — PX: COLONOSCOPY WITH PROPOFOL: SHX5780

## 2014-11-04 SURGERY — COLONOSCOPY WITH PROPOFOL
Anesthesia: Monitor Anesthesia Care

## 2014-11-04 MED ORDER — LACTATED RINGERS IV SOLN
INTRAVENOUS | Status: DC
  Administered 2014-11-04: 1000 mL via INTRAVENOUS

## 2014-11-04 MED ORDER — SODIUM CHLORIDE 0.9 % IV SOLN: INTRAVENOUS | Status: DC

## 2014-11-04 MED ORDER — PROPOFOL 10 MG/ML IV BOLUS
INTRAVENOUS | Status: AC
  Filled 2014-11-04: qty 20

## 2014-11-04 MED ORDER — PROPOFOL 10 MG/ML IV BOLUS
INTRAVENOUS | Status: DC | PRN
  Administered 2014-11-04 (×3): 100 mg via INTRAVENOUS

## 2014-11-04 MED ORDER — LIDOCAINE HCL (PF) 2 % IJ SOLN
INTRAMUSCULAR | Status: DC | PRN
  Administered 2014-11-04: 20 mg via INTRADERMAL

## 2014-11-04 SURGICAL SUPPLY — 21 items

## 2014-11-04 NOTE — Transfer of Care (Signed)
Immediate Anesthesia Transfer of Care Note  Patient: [name]  Procedure(s) Performed: Procedure(s): COLONOSCOPY WITH PROPOFOL (N/A)  Patient Location: PACU  Anesthesia Type:MAC  Level of Consciousness:  sedated, patient cooperative and responds to stimulation  Airway & Oxygen Therapy:Patient Spontanous Breathing and Patient connected to face mask oxgen  Post-op Assessment:  Report given to PACU RN and Post -op Vital signs reviewed and stable  Post vital signs:  Reviewed and stable  Last Vitals:  Filed Vitals:   11/04/14 1056  BP: 136/84  Pulse: 92  Temp: 36.7 C  Resp: 10    Complications: No apparent anesthesia complications

## 2014-11-04 NOTE — Discharge Instructions (Signed)

## 2014-11-04 NOTE — Anesthesia Postprocedure Evaluation (Signed)
  Anesthesia Post-op Note  Patient: [name]  Procedure(s) Performed: Procedure(s): COLONOSCOPY WITH PROPOFOL (N/A)  Patient Location: PACU  Anesthesia Type:MAC  Level of Consciousness: awake, alert  and oriented  Airway and Oxygen Therapy: Patient Spontanous Breathing  Post-op Pain: none  Post-op Assessment: Post-op Vital signs reviewed and Patient's Cardiovascular Status Stable              Post-op Vital Signs: Reviewed and stable  Last Vitals:  Filed Vitals:   11/04/14 1217  BP: 107/43  Pulse: 73  Temp: 36.4 C  Resp: 18    Complications: No apparent anesthesia complications

## 2014-11-04 NOTE — Op Note (Signed)
Problem: Chronic watery diarrhea with abdominal cramps. 09/17/2008 normal screening colonoscopy  Endoscopist: Danise Edge  Premedication: Propofol administered by anesthesia  Procedure: Diagnostic colonoscopy The patient was placed in the left lateral decubitus position. Anal inspection and digital rectal exam were normal. The Pentax pediatric colonoscope was introduced into the rectum and advanced to the cecum. A normal-appearing appendiceal orifice was identified. A normal-appearing ileocecal valve was intubated and the terminal ileum inspected. Colonic preparation for the exam today was good. Withdrawal time was 12 minutes  Rectum. Normal.  Sigmoid colon and descending colon. Normal  Splenic flexure. Normal  Transverse colon. Normal  Hepatic flexure. Normal  Ascending colon. Normal  Cecum and ileocecal valve. Normal  Terminal ileum. Normal  Random colon biopsies. Biopsies were performed from the right colon and left colon to look for microscopic colitis  Assessment: Normal colonoscopy. Random colon biopsies to look for microscopic colitis pending  Recommendation: I prescribed sublingual hyoscyamine 0.125 mg tablet dissolved beneath the time at the onset of abdominal cramps. If the colonic biopsies show microscopic colitis, prescribed Entocort (3 mg tablets): Take 3 tablets each morning for 6 weeks, 2 tablets each morning for one week, and finally one tablet each morning for one week. Entocort is very expensive. An alternative to Entocort if the drug is too expensive is a tapering course of prednisone.  Schedule repeat screening colonoscopy in 10 years

## 2014-11-04 NOTE — H&P (Signed)
  Problem: Chronic, watery diarrhea with abdominal cramps on omeprazole. 09/17/2008 normal screening colonoscopy  History: The patient is a 54 year old female born [birth year]. On 06/28/2014 the patient was evaluated in the emergency room with chest pain. Basic metabolic profile was normal. White blood cell count was 7400. Hemoglobin was 13.1 g. Platelet count was 272,000. D-dimer test was elevated. CT scan of the chest did not show pulmonary emboli. Electrocardiogram showed no ischemic changes.  The patient has intermittent watery diarrhea alternating with constipation which has been present for over a year. She denies gastrointestinal bleeding, nocturnal diarrhea, fever, or weight loss. She denies recent anti-biotic intake or travel.  Sometimes after meals and at times on an empty stomach she will develop the intense urge to have bowel movements usually preceded by mild abdominal cramps followed by the passage of watery, nonbloody diarrhea.  Laboratory data performed in early April 2016: CBC was normal. Complete metabolic profile was normal. Thyroid stimulating hormone level was normal. Vitamin D level was low. Tissue transglutaminase antibody IgA was normal. Stool culture for enteric pathogens was negative. Stool screen for C. difficile toxin was negative. Stool ova and parasite exam on one specimen was negative.  Approximately 20 years ago, the patient underwent a sigmoidoscopy and was diagnosed with irritable bowel syndrome. She was prescribed a tablets to dissolve beneath her time to treat colon spasm.  The patient is scheduled to undergo diagnostic colonoscopy to rule out microscopic colitis associated with omeprazole use.  Past medical history: Irritable bowel syndrome. Respiratory allergies. Hypercholesterolemia. Generalized anxiety disorder.  TAH-BSO. Right rotator cuff surgery.  Exam: The patient is alert and lying comfortably on the endoscopy stretcher. Abdomen is soft and nontender to  palpation. Lungs are clear to auscultation. Cardiac exam reveals a regular rhythm.  Plan: Proceed with diagnostic colonoscopy and perform random colon biopsies for microscopic colitis

## 2014-11-04 NOTE — Anesthesia Preprocedure Evaluation (Addendum)
Anesthesia Evaluation  Patient identified by MRN, date of birth, ID band Patient awake    Reviewed: Allergy & Precautions, NPO status , Patient's Chart, lab work & pertinent test results  Airway Mallampati: III  TM Distance: >3 FB Neck ROM: Full    Dental  (+) Teeth Intact   Pulmonary  breath sounds clear to auscultation        Cardiovascular negative cardio ROS  Rhythm:Regular Rate:Normal     Neuro/Psych PSYCHIATRIC DISORDERS Anxiety Depression    GI/Hepatic Neg liver ROS, GERD-  Medicated,  Endo/Other  negative endocrine ROS  Renal/GU negative Renal ROS  negative genitourinary   Musculoskeletal  (+) Arthritis -, Osteoarthritis,    Abdominal   Peds negative pediatric ROS (+)  Hematology negative hematology ROS (+)   Anesthesia Other Findings   Reproductive/Obstetrics negative OB ROS                           EKG: normal sinus rhythm.  Anesthesia Physical Anesthesia Plan  ASA: II  Anesthesia Plan: MAC   Post-op Pain Management:    Induction: Intravenous  Airway Management Planned: Natural Airway and Simple Face Mask  Additional Equipment:   Intra-op Plan:   Post-operative Plan:   Informed Consent: I have reviewed the patients History and Physical, chart, labs and discussed the procedure including the risks, benefits and alternatives for the proposed anesthesia with the patient or authorized representative who has indicated his/her understanding and acceptance.   Dental advisory given  Plan Discussed with:   Anesthesia Plan Comments:         Anesthesia Quick Evaluation

## 2014-11-05 ENCOUNTER — Encounter (HOSPITAL_COMMUNITY): Payer: Self-pay | Admitting: Gastroenterology

## 2014-11-08 ENCOUNTER — Emergency Department (HOSPITAL_COMMUNITY)
Admission: EM | Admit: 2014-11-08 | Discharge: 2014-11-08 | Disposition: A | Discharge status: Home / Self Care | Payer: BLUE CROSS/BLUE SHIELD | Attending: Emergency Medicine | Admitting: Emergency Medicine

## 2014-11-08 ENCOUNTER — Encounter (HOSPITAL_COMMUNITY): Payer: Self-pay | Admitting: Emergency Medicine

## 2014-11-08 ENCOUNTER — Emergency Department (HOSPITAL_COMMUNITY): Payer: BLUE CROSS/BLUE SHIELD

## 2014-11-08 DIAGNOSIS — K219 Gastro-esophageal reflux disease without esophagitis: Secondary | ICD-10-CM | POA: Diagnosis not present

## 2014-11-08 DIAGNOSIS — F418 Other specified anxiety disorders: Secondary | ICD-10-CM | POA: Diagnosis not present

## 2014-11-08 DIAGNOSIS — R109 Unspecified abdominal pain: Secondary | ICD-10-CM | POA: Diagnosis present

## 2014-11-08 DIAGNOSIS — R140 Abdominal distension (gaseous): Secondary | ICD-10-CM

## 2014-11-08 DIAGNOSIS — Z79899 Other long term (current) drug therapy: Secondary | ICD-10-CM | POA: Insufficient documentation

## 2014-11-08 DIAGNOSIS — G4700 Insomnia, unspecified: Secondary | ICD-10-CM | POA: Diagnosis not present

## 2014-11-08 DIAGNOSIS — R52 Pain, unspecified: Secondary | ICD-10-CM

## 2014-11-08 DIAGNOSIS — Z872 Personal history of diseases of the skin and subcutaneous tissue: Secondary | ICD-10-CM | POA: Diagnosis not present

## 2014-11-08 DIAGNOSIS — E559 Vitamin D deficiency, unspecified: Secondary | ICD-10-CM | POA: Insufficient documentation

## 2014-11-08 DIAGNOSIS — K5900 Constipation, unspecified: Secondary | ICD-10-CM

## 2014-11-08 DIAGNOSIS — Z8709 Personal history of other diseases of the respiratory system: Secondary | ICD-10-CM | POA: Insufficient documentation

## 2014-11-08 DIAGNOSIS — F329 Major depressive disorder, single episode, unspecified: Secondary | ICD-10-CM | POA: Diagnosis not present

## 2014-11-08 DIAGNOSIS — Z8739 Personal history of other diseases of the musculoskeletal system and connective tissue: Secondary | ICD-10-CM | POA: Diagnosis not present

## 2014-11-08 LAB — URINE MICROSCOPIC-ADD ON

## 2014-11-08 LAB — COMPREHENSIVE METABOLIC PANEL
ALK PHOS: 71 U/L (ref 38–126)
ALT: 17 U/L (ref 14–54)
AST: 22 U/L (ref 15–41)
Albumin: 3.6 g/dL (ref 3.5–5.0)
Anion gap: 8 (ref 5–15)
BILIRUBIN TOTAL: 0.3 mg/dL (ref 0.3–1.2)
BUN: 17 mg/dL (ref 6–20)
CALCIUM: 8.9 mg/dL (ref 8.9–10.3)
CO2: 24 mmol/L (ref 22–32)
CREATININE: 0.71 mg/dL (ref 0.44–1.00)
Chloride: 107 mmol/L (ref 101–111)
GFR calc Af Amer: >60 mL/min (ref >60)
GLUCOSE: 108 mg/dL — AB (ref 65–99)
POTASSIUM: 3.8 mmol/L (ref 3.5–5.1)
Sodium: 139 mmol/L (ref 135–145)
TOTAL PROTEIN: 7 g/dL (ref 6.5–8.1)

## 2014-11-08 LAB — URINALYSIS, ROUTINE W REFLEX MICROSCOPIC
BILIRUBIN URINE: NEGATIVE
Glucose, UA: NEGATIVE mg/dL
KETONES UR: NEGATIVE mg/dL
Leukocytes, UA: NEGATIVE
NITRITE: NEGATIVE
PROTEIN: NEGATIVE mg/dL
Specific Gravity, Urine: 1.027 (ref 1.005–1.030)
UROBILINOGEN UA: 0.2 mg/dL (ref 0.0–1.0)
pH: 5.5 (ref 5.0–8.0)

## 2014-11-08 LAB — LIPASE, BLOOD: Lipase: 25 U/L (ref 22–51)

## 2014-11-08 LAB — CBC
HEMATOCRIT: 37.5 % (ref 36.0–46.0)
Hemoglobin: 12.6 g/dL (ref 12.0–15.0)
MCH: 30.5 pg (ref 26.0–34.0)
MCHC: 33.6 g/dL (ref 30.0–36.0)
MCV: 90.8 fL (ref 78.0–100.0)
PLATELETS: 248 10*3/uL (ref 150–400)
RBC: 4.13 MIL/uL (ref 3.87–5.11)
RDW: 12.9 % (ref 11.5–15.5)
WBC: 6.5 10*3/uL (ref 4.0–10.5)

## 2014-11-08 MED ORDER — DICYCLOMINE HCL 10 MG PO CAPS: 10.0000 mg | ORAL_CAPSULE | Freq: Three times a day (TID) | ORAL | Status: DC

## 2014-11-08 MED ORDER — SIMETHICONE 80 MG PO CHEW
80.0000 mg | CHEWABLE_TABLET | Freq: Once | ORAL | Status: AC
  Administered 2014-11-08: 80 mg via ORAL
  Filled 2014-11-08: qty 1

## 2014-11-08 NOTE — Discharge Instructions (Signed)
Abdominal Pain Return for inability to pass gas or stool. Follow-up with gastroenterology.  Many things can cause abdominal pain. Usually, abdominal pain is not caused by a disease and will improve without treatment. It can often be observed and treated at home. Your health care provider will do a physical exam and possibly order blood tests and X-rays to help determine the seriousness of your pain. However, in many cases, more time must pass before a clear cause of the pain can be found. Before that point, your health care provider may not know if you need more testing or further treatment. HOME CARE INSTRUCTIONS  Monitor your abdominal pain for any changes. The following actions may help to alleviate any discomfort you are experiencing:  Only take over-the-counter or prescription medicines as directed by your health care provider.  Do not take laxatives unless directed to do so by your health care provider.  Try a clear liquid diet (broth, tea, or water) as directed by your health care provider. Slowly move to a bland diet as tolerated. SEEK MEDICAL CARE IF:  You have unexplained abdominal pain.  You have abdominal pain associated with nausea or diarrhea.  You have pain when you urinate or have a bowel movement.  You experience abdominal pain that wakes you in the night.  You have abdominal pain that is worsened or improved by eating food.  You have abdominal pain that is worsened with eating fatty foods.  You have a fever. SEEK IMMEDIATE MEDICAL CARE IF:   Your pain does not go away within 2 hours.  You keep throwing up (vomiting).  Your pain is felt only in portions of the abdomen, such as the right side or the left lower portion of the abdomen.  You pass bloody or black tarry stools. MAKE SURE YOU:  Understand these instructions.   Will watch your condition.   Will get help right away if you are not doing well or get worse.  Document Released: 12/06/2004 Document  Revised: 03/03/2013 Document Reviewed: 11/05/2012 [facility] Patient Information 2015 [place], [place]. This information is not intended to replace advice given to you by your health care provider. Make sure you discuss any questions you have with your health care provider.

## 2014-11-08 NOTE — ED Notes (Signed)
Pt called to exam room, however no answer from the lobby.  

## 2014-11-08 NOTE — ED Provider Notes (Signed)
CSN: [id number]     Arrival date & time 11/08/14  1744 History   First MD Initiated Contact with Patient 11/08/14 2106     Chief Complaint  Patient presents with  . Abdominal Pain    with distention, s/p colonoscopy x5 days ago     (Consider location/radiation/quality/duration/timing/severity/associated sxs/prior Treatment) Patient is a 54 y.o. female presenting with abdominal pain. The history is provided by the patient. No language interpreter was used.  Abdominal Pain Associated symptoms: constipation   Associated symptoms: no diarrhea, no fever, no nausea and no vomiting   Ms. Len Childs is a 54 y.o female with a history of constipation and IBS who presents for abdominal distention and discomfort since having a colonoscopy 5 days ago. She states she had her last BM 3 days ago. She has had episodes of no bowel movement for one week in the past. She states her abdomen has progressively worsened and hardened.  She has taken gas-x and ondansetron. She also took Ambien and hyoscyamine to help her sleep and aleviate the pain. She states she had a single episode of quarter size blood in the toilet after the colonoscopy which she stated that gastroenterology told her to expect. She denies any bleeding after that incident. She denies any fever, chills, chest pain, shortness of breath, nausea, vomiting, diarrhea, dysuria, hematuria, urinary frequency.  Past Medical History  Diagnosis Date  . Ulcer   . Depression with anxiety     r/t death of mother  . Shortness of breath     JUST WHEN I MOVE - RELATED TO MY BACK PAIN  . Insomnia     takes Xanax nightly as needed as well as Ambien  . Depression     takes Wellbutrin daily  . Constipation     takes Colace daily as needed  . Muscle spasm     takes Robaxin daily as needed  . History of bronchitis yrs ago  . Dizziness   . Weakness     numbness and tingling in right leg  . Back pain     herniated disc  . Vitamin D deficiency     takes Vit D  weekly  . GERD (gastroesophageal reflux disease)     was taking Prilosec but has been off 2-3 wks  . Urinary frequency   . Nocturia   . Anxiety     takes Xanax as needed  . Headache(784.0)     occasionally  . Arthritis   . DDD (degenerative disc disease)    Past Surgical History  Procedure Laterality Date  . Ovarian cyst surgery    . Shoulder arthroscopy with rotator cuff repair Right   . Lumbar laminectomy/decompression microdiscectomy N/A 08/26/2013    Procedure: MICRO LUMBER DECOMPRESSION L5-S1 BILATERAL;  Surgeon: Javier Docker, MD;  Location: [facility];  Service: Orthopedics;  Laterality: N/A;  . Exploratory laparotomy    . Colonoscopy    . Lumbar disc surgery  10/14/2013    revision L5-S1  . Back surgery    . Tubal ligation  1980's  . Abdominal hysterectomy    . Lumbar laminectomy/decompression microdiscectomy Right 10/14/2013    Procedure: REVISION RIGHT L5-S1 DISCECTOMY ;  Surgeon: Venita Lick, MD;  Location: [facility] OR;  Service: Orthopedics;  Laterality: Right;  . Colonoscopy with propofol N/A 11/04/2014    Procedure: COLONOSCOPY WITH PROPOFOL;  Surgeon: Charolett Bumpers, MD;  Location: [facility];  Service: Endoscopy;  Laterality: N/A;   Family History  Problem Relation Age of  Onset  . COPD Father   . Transient ischemic attack Mother     multiple   Social History  Substance Use Topics  . Smoking status: Never Smoker   . Smokeless tobacco: Never Used  . Alcohol Use: No   OB History    No data available     Review of Systems  Constitutional: Negative for fever.  Gastrointestinal: Positive for abdominal pain, constipation and abdominal distention. Negative for nausea, vomiting and diarrhea.  All other systems reviewed and are negative.     Allergies  Review of patient's allergies indicates no known allergies.  Home Medications   Prior to Admission medications   Medication Sig Start Date End Date Taking? Authorizing Provider  alprazolam Prudy Feeler) 2 MG tablet  Take 2 mg by mouth 2 (two) times daily as needed for anxiety.    Yes Historical Provider, MD  aspirin-acetaminophen-caffeine (EXCEDRIN MIGRAINE) [phone] MG per tablet Take 2 tablets by mouth every 6 (six) hours as needed for headache.   Yes Historical Provider, MD  Cholecalciferol (VITAMIN D) 2000 UNITS CAPS Take 2,000 Units by mouth daily.   Yes Historical Provider, MD  estradiol (ESTRACE) 0.5 MG tablet Take 0.25-0.5 mg by mouth 2 (two) times daily. 1/2 tab in the morning and 1 tab at night   Yes Historical Provider, MD  famotidine (PEPCID) 20 MG tablet Take 20 mg by mouth daily.   Yes Historical Provider, MD  ondansetron (ZOFRAN) 4 MG tablet Take 1 tablet (4 mg total) by mouth every 8 (eight) hours as needed for nausea or vomiting. 07/26/14  Yes Toy Cookey, MD  tolterodine (DETROL LA) 4 MG 24 hr capsule Take 4 mg by mouth daily.   Yes Historical Provider, MD  zolpidem (AMBIEN) 10 MG tablet Take 10 mg by mouth at bedtime.  08/06/13  Yes Historical Provider, MD  dicyclomine (BENTYL) 10 MG capsule Take 1 capsule (10 mg total) by mouth 4 (four) times daily -  before meals and at bedtime. 11/08/14   Blanchie Zeleznik Patel-Mills, PA-C  methocarbamol (ROBAXIN) 500 MG tablet Take 1 tablet (500 mg total) by mouth every 8 (eight) hours as needed for muscle spasms. Patient not taking: Reported on 08/27/2014 05/03/14   Linwood Dibbles, MD  oxyCODONE (ROXICODONE) 5 MG immediate release tablet Take 1 tablet (5 mg total) by mouth every 6 (six) hours as needed for severe pain. Patient not taking: Reported on 08/27/2014 07/26/14   Toy Cookey, MD   BP 110/60 mmHg  Pulse 68  Temp(Src) 97.9 F (36.6 C) (Oral)  Resp 16  SpO2 100% Physical Exam  Constitutional: She is oriented to person, place, and time. She appears well-developed and well-nourished.  HENT:  Head: Normocephalic and atraumatic.  Eyes: Conjunctivae are normal.  Neck: Normal range of motion. Neck supple.  Cardiovascular: Normal rate, regular rhythm and normal  heart sounds.   Pulmonary/Chest: Effort normal and breath sounds normal. No respiratory distress. She has no wheezes.  Abdominal: Soft. Normal appearance. She exhibits no distension. There is tenderness in the suprapubic area. There is no rebound and no guarding.    Mild suprapubic tenderness to palpation. No abdominal distention. No guarding or rebound.  Musculoskeletal: Normal range of motion.  Neurological: She is alert and oriented to person, place, and time.  Skin: Skin is warm and dry.  Psychiatric: She has a normal mood and affect. Her behavior is normal.  Nursing note and vitals reviewed.   ED Course  Procedures (including critical care time) Labs Review Labs Reviewed  COMPREHENSIVE METABOLIC PANEL - Abnormal; Notable for the following:    Glucose, Bld 108 (*)    All other components within normal limits  URINALYSIS, ROUTINE W REFLEX MICROSCOPIC (NOT AT [facility]) - Abnormal; Notable for the following:    Hgb urine dipstick MODERATE (*)    All other components within normal limits  URINE MICROSCOPIC-ADD ON - Abnormal; Notable for the following:    Squamous Epithelial / LPF FEW (*)    Bacteria, UA FEW (*)    All other components within normal limits  LIPASE, BLOOD  CBC    Imaging Review Dg Abd 2 Views  11/08/2014   CLINICAL DATA:  Colonoscopy 4 days ago, abdominal distention  EXAM: ABDOMEN - 2 VIEW  COMPARISON:  No similar prior exam is available at this institution for comparison or on [employer].  FINDINGS: The bowel gas pattern is normal. There is no evidence of free air. No radio-opaque calculi or other significant radiographic abnormality is seen. Mild stool burden.  IMPRESSION: Negative.   Electronically Signed   By: Christiana Pellant M.D.   On: 11/08/2014 18:47   I have personally reviewed and evaluated these images and lab results as part of my medical decision-making.   EKG Interpretation None      MDM   Final diagnoses:  Abdominal bloating  Constipation,  unspecified constipation type  Patient presents for abdominal bloating/gas and constipation.  Gastroenterologist: Dr. Laural Benes. Upon review of his notes he suspected that the patient may have colitis but was waiting for biopsy results to start the patient on possible prednisone. Biopsy results are still pending. Her vitals are stable. Her labs are unremarkable. X-ray of her abdomen shows mild stool burden but no obstruction. She has no peritoneal signs. She seems anxious but otherwise is well-appearing. Medications  simethicone (MYLICON) chewable tablet 80 mg (80 mg Oral Given 11/08/14 2234)  She was given Bentyl upon discharge and I discussed following up with her gastroenterologist. Patient verbally agrees with the plan.     Catha Gosselin, PA-C 11/08/14 2314  Azalia Bilis, MD 11/09/14 [phone]

## 2014-11-08 NOTE — ED Notes (Signed)
Pt A+Ox4, reports c/o abd pain and distention "for a while now", had colonoscopy x5 days ago for same and reports symptoms have worsened since.  Pt reports initially had diarrhea after procedure and now is feeling constipated, last BM x2 days ago.  +nausea.  Denies fevers/chills.  Pt appears uncomfortable.  Self repositioning for comfort.  MAEI.  Abd distended.  Skin PWD.  Speaking full/clear sentences, rr even/un-lab.  NAD.

## 2014-11-10 ENCOUNTER — Other Ambulatory Visit: Payer: Self-pay | Admitting: Gastroenterology

## 2014-11-10 DIAGNOSIS — R1032 Left lower quadrant pain: Secondary | ICD-10-CM

## 2014-11-11 ENCOUNTER — Ambulatory Visit
Admission: RE | Admit: 2014-11-11 | Discharge: 2014-11-11 | Disposition: A | Discharge status: Home / Self Care | Payer: BLUE CROSS/BLUE SHIELD | Source: Ambulatory Visit | Attending: Gastroenterology | Admitting: Gastroenterology

## 2014-11-11 DIAGNOSIS — R1032 Left lower quadrant pain: Secondary | ICD-10-CM

## 2014-11-11 MED ORDER — IOPAMIDOL (ISOVUE-300) INJECTION 61%
100.0000 mL | Freq: Once | INTRAVENOUS | Status: AC | PRN
  Administered 2014-11-11: 100 mL via INTRAVENOUS

## 2015-03-30 ENCOUNTER — Emergency Department (HOSPITAL_COMMUNITY)
Admission: EM | Admit: 2015-03-30 | Discharge: 2015-03-30 | Disposition: A | Discharge status: Home / Self Care | Payer: BLUE CROSS/BLUE SHIELD | Attending: Emergency Medicine | Admitting: Emergency Medicine

## 2015-03-30 ENCOUNTER — Emergency Department (HOSPITAL_COMMUNITY): Payer: BLUE CROSS/BLUE SHIELD

## 2015-03-30 ENCOUNTER — Encounter (HOSPITAL_COMMUNITY): Payer: Self-pay | Admitting: Nurse Practitioner

## 2015-03-30 DIAGNOSIS — Y9389 Activity, other specified: Secondary | ICD-10-CM | POA: Diagnosis not present

## 2015-03-30 DIAGNOSIS — Z8709 Personal history of other diseases of the respiratory system: Secondary | ICD-10-CM | POA: Insufficient documentation

## 2015-03-30 DIAGNOSIS — Y92513 Shop (commercial) as the place of occurrence of the external cause: Secondary | ICD-10-CM | POA: Insufficient documentation

## 2015-03-30 DIAGNOSIS — K5900 Constipation, unspecified: Secondary | ICD-10-CM | POA: Insufficient documentation

## 2015-03-30 DIAGNOSIS — W010XXA Fall on same level from slipping, tripping and stumbling without subsequent striking against object, initial encounter: Secondary | ICD-10-CM | POA: Insufficient documentation

## 2015-03-30 DIAGNOSIS — F418 Other specified anxiety disorders: Secondary | ICD-10-CM | POA: Diagnosis not present

## 2015-03-30 DIAGNOSIS — Z79899 Other long term (current) drug therapy: Secondary | ICD-10-CM | POA: Diagnosis not present

## 2015-03-30 DIAGNOSIS — K219 Gastro-esophageal reflux disease without esophagitis: Secondary | ICD-10-CM | POA: Diagnosis not present

## 2015-03-30 DIAGNOSIS — S39012A Strain of muscle, fascia and tendon of lower back, initial encounter: Secondary | ICD-10-CM | POA: Diagnosis not present

## 2015-03-30 DIAGNOSIS — G4700 Insomnia, unspecified: Secondary | ICD-10-CM | POA: Diagnosis not present

## 2015-03-30 DIAGNOSIS — M1990 Unspecified osteoarthritis, unspecified site: Secondary | ICD-10-CM | POA: Diagnosis not present

## 2015-03-30 DIAGNOSIS — Y998 Other external cause status: Secondary | ICD-10-CM | POA: Diagnosis not present

## 2015-03-30 DIAGNOSIS — S29012A Strain of muscle and tendon of back wall of thorax, initial encounter: Secondary | ICD-10-CM | POA: Insufficient documentation

## 2015-03-30 DIAGNOSIS — S29002A Unspecified injury of muscle and tendon of back wall of thorax, initial encounter: Secondary | ICD-10-CM | POA: Diagnosis present

## 2015-03-30 DIAGNOSIS — E559 Vitamin D deficiency, unspecified: Secondary | ICD-10-CM | POA: Insufficient documentation

## 2015-03-30 DIAGNOSIS — S29019A Strain of muscle and tendon of unspecified wall of thorax, initial encounter: Secondary | ICD-10-CM

## 2015-03-30 DIAGNOSIS — W19XXXA Unspecified fall, initial encounter: Secondary | ICD-10-CM

## 2015-03-30 MED ORDER — OXYCODONE-ACETAMINOPHEN 5-325 MG PO TABS
2.0000 | ORAL_TABLET | Freq: Once | ORAL | Status: AC
  Administered 2015-03-30: 2 via ORAL
  Filled 2015-03-30: qty 2

## 2015-03-30 MED ORDER — HYDROMORPHONE HCL 1 MG/ML IJ SOLN
1.0000 mg | Freq: Once | INTRAMUSCULAR | Status: AC
Start: 2015-03-30 — End: 2015-03-30
  Administered 2015-03-30: 1 mg via INTRAMUSCULAR
  Filled 2015-03-30: qty 1

## 2015-03-30 MED ORDER — NAPROXEN 375 MG PO TABS: 375.0000 mg | ORAL_TABLET | Freq: Two times a day (BID) | ORAL | Status: DC

## 2015-03-30 MED ORDER — ORPHENADRINE CITRATE ER 100 MG PO TB12: 100.0000 mg | ORAL_TABLET | Freq: Two times a day (BID) | ORAL | Status: DC

## 2015-03-30 MED ORDER — KETOROLAC TROMETHAMINE 60 MG/2ML IM SOLN
60.0000 mg | Freq: Once | INTRAMUSCULAR | Status: AC
  Administered 2015-03-30: 60 mg via INTRAMUSCULAR
  Filled 2015-03-30: qty 2

## 2015-03-30 MED ORDER — OXYCODONE-ACETAMINOPHEN 5-325 MG PO TABS: 1.0000 | ORAL_TABLET | ORAL | Status: DC | PRN

## 2015-03-30 NOTE — Discharge Instructions (Signed)
Cervical Sprain  A cervical sprain is an injury in the neck in which the strong, fibrous tissues (ligaments) that connect your neck bones stretch or tear. Cervical sprains can range from mild to severe. Severe cervical sprains can cause the neck vertebrae to be unstable. This can lead to damage of the spinal cord and can result in serious nervous system problems. The amount of time it takes for a cervical sprain to get better depends on the cause and extent of the injury. Most cervical sprains heal in 1 to 3 weeks.  CAUSES   Severe cervical sprains may be caused by:    Contact sport injuries (such as from football, rugby, wrestling, hockey, auto racing, gymnastics, diving, martial arts, or boxing).    Motor vehicle collisions.    Whiplash injuries. This is an injury from a sudden forward and backward whipping movement of the head and neck.   Falls.   Mild cervical sprains may be caused by:    Being in an awkward position, such as while cradling a telephone between your ear and shoulder.    Sitting in a chair that does not offer proper support.    Working at a poorly designed computer station.    Looking up or down for long periods of time.   SYMPTOMS    Pain, soreness, stiffness, or a burning sensation in the front, back, or sides of the neck. This discomfort may develop immediately after the injury or slowly, 24 hours or more after the injury.    Pain or tenderness directly in the middle of the back of the neck.    Shoulder or upper back pain.    Limited ability to move the neck.    Headache.    Dizziness.    Weakness, numbness, or tingling in the hands or arms.    Muscle spasms.    Difficulty swallowing or chewing.    Tenderness and swelling of the neck.   DIAGNOSIS   Most of the time your health care provider can diagnose a cervical sprain by taking your history and doing a physical exam. Your health care provider will ask about previous neck injuries and any known neck  problems, such as arthritis in the neck. X-rays may be taken to find out if there are any other problems, such as with the bones of the neck. Other tests, such as a CT scan or MRI, may also be needed.   TREATMENT   Treatment depends on the severity of the cervical sprain. Mild sprains can be treated with rest, keeping the neck in place (immobilization), and pain medicines. Severe cervical sprains are immediately immobilized. Further treatment is done to help with pain, muscle spasms, and other symptoms and may include:   Medicines, such as pain relievers, numbing medicines, or muscle relaxants.    Physical therapy. This may involve stretching exercises, strengthening exercises, and posture training. Exercises and improved posture can help stabilize the neck, strengthen muscles, and help stop symptoms from returning.   HOME CARE INSTRUCTIONS    Put ice on the injured area.     Put ice in a plastic bag.     Place a towel between your skin and the bag.     Leave the ice on for 15-20 minutes, 3-4 times a day.    If your injury was severe, you may have been given a cervical collar to wear. A cervical collar is a two-piece collar designed to keep your neck from moving while it heals.      Do not remove the collar unless instructed by your health care provider.    If you have long hair, keep it outside of the collar.    Ask your health care provider before making any adjustments to your collar. Minor adjustments may be required over time to improve comfort and reduce pressure on your chin or on the back of your head.    Ifyou are allowed to remove the collar for cleaning or bathing, follow your health care provider's instructions on how to do so safely.    Keep your collar clean by wiping it with mild soap and water and drying it completely. If the collar you have been given includes removable pads, remove them every 1-2 days and hand wash them with soap and water. Allow them to air dry. They should be completely  dry before you wear them in the collar.    If you are allowed to remove the collar for cleaning and bathing, wash and dry the skin of your neck. Check your skin for irritation or sores. If you see any, tell your health care provider.    Do not drive while wearing the collar.    Only take over-the-counter or prescription medicines for pain, discomfort, or fever as directed by your health care provider.    Keep all follow-up appointments as directed by your health care provider.    Keep all physical therapy appointments as directed by your health care provider.    Make any needed adjustments to your workstation to promote good posture.    Avoid positions and activities that make your symptoms worse.    Warm up and stretch before being active to help prevent problems.   SEEK MEDICAL CARE IF:    Your pain is not controlled with medicine.    You are unable to decrease your pain medicine over time as planned.    Your activity level is not improving as expected.   SEEK IMMEDIATE MEDICAL CARE IF:    You develop any bleeding.   You develop stomach upset.   You have signs of an allergic reaction to your medicine.    Your symptoms get worse.    You develop new, unexplained symptoms.    You have numbness, tingling, weakness, or paralysis in any part of your body.   MAKE SURE YOU:    Understand these instructions.   Will watch your condition.   Will get help right away if you are not doing well or get worse.     This information is not intended to replace advice given to you by your health care provider. Make sure you discuss any questions you have with your health care provider.     Document Released: 12/24/2006 Document Revised: 03/03/2013 Document Reviewed: 09/03/2012  [employer] Interactive Patient Education 2016 [employer]

## 2015-03-30 NOTE — ED Notes (Signed)
Patient was shopping in [employer], slipped on the floor and fell. She now complains of back pain, right ankle pain, and right elbow pain. EMS endorses point tenderness on the thoracic vertebrae and weakness. Pt transported in [facility].

## 2015-03-30 NOTE — ED Notes (Signed)
Patient transported to X-ray 

## 2015-03-30 NOTE — ED Provider Notes (Signed)
CSN: [id number]     Arrival date & time 03/30/15  1421 History   First MD Initiated Contact with Patient 03/30/15 1508     Chief Complaint  Patient presents with  . Fall     (Consider location/radiation/quality/duration/timing/severity/associated sxs/prior Treatment) HPI Patient reports she was shopping at [employer] and picking some prescriptions. She slipped and fell the floor landing on her right side. He reports her right ankle seemed to roll and a bit. She tried to brace herself landing on her right elbow. She reports that caused her back to twist a lot. She did not strike her head and did not have loss of consciousness. She reports she had a lot of burning pain in her lower back. She had had injections in her lower back within the past 4 weeks. She has prior history of herniated disks of the lower back. She denies experiencing weakness numbness or tingling of the legs. She denies upper extremity weakness numbness or tinging. She reports when the EMS palpated her central upper back she did have a lot of reproducible pain. She denies chest pain or abdominal pain. Patient reports when she was on the floor she noticed some white powdery material. She states she was later told that they had a spill of laundry detergent that they had tried to clean up. Past Medical History  Diagnosis Date  . Ulcer   . Depression with anxiety     r/t death of mother  . Shortness of breath     JUST WHEN I MOVE - RELATED TO MY BACK PAIN  . Insomnia     takes Xanax nightly as needed as well as Ambien  . Depression     takes Wellbutrin daily  . Constipation     takes Colace daily as needed  . Muscle spasm     takes Robaxin daily as needed  . History of bronchitis yrs ago  . Dizziness   . Weakness     numbness and tingling in right leg  . Back pain     herniated disc  . Vitamin D deficiency     takes Vit D weekly  . GERD (gastroesophageal reflux disease)     was taking Prilosec but has been off 2-3 wks  .  Urinary frequency   . Nocturia   . Anxiety     takes Xanax as needed  . Headache(784.0)     occasionally  . Arthritis   . DDD (degenerative disc disease)    Past Surgical History  Procedure Laterality Date  . Ovarian cyst surgery    . Shoulder arthroscopy with rotator cuff repair Right   . Lumbar laminectomy/decompression microdiscectomy N/A 08/26/2013    Procedure: MICRO LUMBER DECOMPRESSION L5-S1 BILATERAL;  Surgeon: Javier Docker, MD;  Location: [facility];  Service: Orthopedics;  Laterality: N/A;  . Exploratory laparotomy    . Colonoscopy    . Lumbar disc surgery  10/14/2013    revision L5-S1  . Back surgery    . Tubal ligation  1980's  . Abdominal hysterectomy    . Lumbar laminectomy/decompression microdiscectomy Right 10/14/2013    Procedure: REVISION RIGHT L5-S1 DISCECTOMY ;  Surgeon: Venita Lick, MD;  Location: [facility];  Service: Orthopedics;  Laterality: Right;  . Colonoscopy with propofol N/A 11/04/2014    Procedure: COLONOSCOPY WITH PROPOFOL;  Surgeon: Charolett Bumpers, MD;  Location: [facility];  Service: Endoscopy;  Laterality: N/A;   Family History  Problem Relation Age of Onset  . COPD Father   .  Transient ischemic attack Mother     multiple   Social History  Substance Use Topics  . Smoking status: Never Smoker   . Smokeless tobacco: Never Used  . Alcohol Use: No   OB History    No data available     Review of Systems  10 Systems reviewed and are negative for acute change except as noted in the HPI.   Allergies  Review of patient's allergies indicates no known allergies.  Home Medications   Prior to Admission medications   Medication Sig Start Date End Date Taking? Authorizing Provider  alprazolam Prudy Feeler) 2 MG tablet Take 2 mg by mouth 2 (two) times daily as needed for anxiety.    Yes Historical Provider, MD  estradiol (ESTRACE) 0.5 MG tablet Take 0.25-0.5 mg by mouth 2 (two) times daily. 1/2 tab in the morning and 1 tab at night   Yes Historical  Provider, MD  Oxycodone HCl 10 MG TABS Take 10 mg by mouth 2 (two) times daily as needed. Pain. 03/16/15  Yes Historical Provider, MD  zolpidem (AMBIEN) 10 MG tablet Take 10 mg by mouth at bedtime.  08/06/13  Yes Historical Provider, MD  naproxen (NAPROSYN) 375 MG tablet Take 1 tablet (375 mg total) by mouth 2 (two) times daily. 03/30/15   Arby Barrette, MD  orphenadrine (NORFLEX) 100 MG tablet Take 1 tablet (100 mg total) by mouth 2 (two) times daily. 03/30/15   Arby Barrette, MD  oxyCODONE-acetaminophen (PERCOCET) 5-325 MG tablet Take 1-2 tablets by mouth every 4 (four) hours as needed. 03/30/15   Arby Barrette, MD   BP 119/69 mmHg  Pulse 66  Temp(Src) 98 F (36.7 C) (Oral)  Resp 20  SpO2 97% Physical Exam  Constitutional: She is oriented to person, place, and time. She appears well-developed and well-nourished.  Patient has cervical collar in place. An is sitting upright with flexible backward. Alert and nontoxic. Color is good. No respiratory distress.  HENT:  Head: Normocephalic and atraumatic.  Right Ear: External ear normal.  Left Ear: External ear normal.  Nose: Nose normal.  Mouth/Throat: Oropharynx is clear and moist.  Eyes: EOM are normal. Pupils are equal, round, and reactive to light.  Neck: Neck supple.  Cardiovascular: Normal rate, regular rhythm, normal heart sounds and intact distal pulses.   Pulmonary/Chest: Effort normal and breath sounds normal. She exhibits no tenderness.  Abdominal: Soft. Bowel sounds are normal. She exhibits no distension. There is no tenderness.  Musculoskeletal: Normal range of motion. She exhibits tenderness. She exhibits no edema.  Patient endorses pain to palpation from approximately C6-T4. Patient also endorses tenderness to palpation in her lumbar spine area and she has prior surgical scar that is well-healed in this region.  Mild tenderness to lateral malleolus on the right ankle. There is no visible sign of abrasion, effusion, ecchymosis.  Normal contours of the foot and ankle. Lower extremity strength testing is 5 out of 5.  Full physical examination with the patient completely undressed is performed. There are no objective areas of ecchymoses, abrasion or swelling. Range of motion is intact at all extremities. Patient is also able to use her legs to elevate her pelvis off of the bed to readjust her gown. She did this spontaneously and did not appear to have pain limitation.  Neurological: She is alert and oriented to person, place, and time. She has normal strength. No cranial nerve deficit. She exhibits normal muscle tone. Coordination normal. GCS eye subscore is 4. GCS verbal subscore is  5. GCS motor subscore is 6.  Skin: Skin is warm, dry and intact.  Psychiatric: She has a normal mood and affect.    ED Course  Procedures (including critical care time) Labs Review Labs Reviewed - No data to display  Imaging Review Dg Thoracic Spine 2 View  03/30/2015  CLINICAL DATA:  [place] at [employer] and fell. Mid to upper back pain. EXAM: THORACIC SPINE 2 VIEWS COMPARISON:  07/26/2014 FINDINGS: Slight rightward scoliosis in the mid thoracic spine, stable. No fracture or subluxation. Disc spaces are maintained. IMPRESSION: No acute bony abnormality or change. Electronically Signed   By: Charlett Nose M.D.   On: 03/30/2015 16:37   Dg Lumbar Spine Complete  03/30/2015  CLINICAL DATA:  Status post fall, back pain. History of previous back surgeries. EXAM: LUMBAR SPINE - COMPLETE 4+ VIEW COMPARISON:  Intraoperative plain film of the lumbar spine dated 08/26/2013. Also sagittal reconstructions from CT dated 11/11/2014. FINDINGS: There are mild degenerative changes scattered throughout the lumbar spine and lower thoracic spine, stable in appearance compared to the previous exams. Osseous alignment is stable. No fracture line or displaced fracture fragment identified. Posterior elements appear intact and well aligned. Upper sacrum appears intact  and well aligned. Paravertebral soft tissues are unremarkable. IMPRESSION: 1. Mild degenerative change, stable compared to previous exams. 2. No acute findings.  No osseous fracture or dislocation seen. Electronically Signed   By: Bary Richard M.D.   On: 03/30/2015 16:41   Dg Ankle Complete Right  03/30/2015  CLINICAL DATA:  [place] and [place] in [employer] today. Generalized right ankle pain. EXAM: RIGHT ANKLE - COMPLETE 3+ VIEW COMPARISON:  None. FINDINGS: There is no evidence of fracture, dislocation, or joint effusion. There is no evidence of arthropathy or other focal bone abnormality. Soft tissues are unremarkable. IMPRESSION: Negative. Electronically Signed   By: Charlett Nose M.D.   On: 03/30/2015 16:37   Ct Cervical Spine Wo Contrast  03/30/2015  CLINICAL DATA:  Fall, slipped on the floor shopping at [employer] EXAM: CT CERVICAL SPINE WITHOUT CONTRAST TECHNIQUE: Multidetector CT imaging of the cervical spine was performed without intravenous contrast. Multiplanar CT image reconstructions were also generated. COMPARISON:  07/26/2014 FINDINGS: Axial images of the cervical spine shows no acute fracture or subluxation. There is no pneumothorax in visualized lung apices. Computer processed images shows no acute fracture or subluxation. Again noted degenerative changes C1-C2 articulation. Again noted disc space flattening with mild anterior and mild posterior spurring at C5-C6 and C6-C7 level. No prevertebral soft tissue swelling. Cervical airway is patent. IMPRESSION: No acute fracture or subluxation. Stable degenerative changes C1-C2 articulation. Again noted disc space flattening with mild anterior and mild posterior spurring at C5-C6 and C6-C7 level. Electronically Signed   By: Natasha Mead M.D.   On: 03/30/2015 16:53   I have personally reviewed and evaluated these images and lab results as part of my medical decision-making.   EKG Interpretation None      MDM   Final diagnoses:  Fall, initial  encounter  Strain of thoracic spine, initial encounter  Strain of lumbar region, initial encounter   Patient reports a mechanical fall while shopping at [employer]. She described observing dry powdery material on the floor that she concludes was laundry soap based on information she obtained while employees were aiding her in the front of the store. At this time she is intact with normal neurologic function and no report of paresthesia or signs of cord compression injury. Her obtained for pain  reports to palpation in the low cervical and high thoracic spine as well as lumbar spine. No acute abnormalities are identified. Patient has been reassessed after x-rays are completed with full muscle skeletal examination and range of motion is intact without neurologic deficit. At this time feel she is safe for discharge. She does have an orthopedic physician who performed her lumbar injections with whom she can follow-up. She'll be treated for pain with naproxen, Norflex and Percocet.    Arby Barrette, MD 03/30/15 [id number]

## 2015-03-30 NOTE — ED Notes (Signed)
Bed: WA09 Expected date:  Expected time:  Means of arrival:  Comments: Ems-fall recent back surgery

## 2015-06-21 DIAGNOSIS — R040 Epistaxis: Secondary | ICD-10-CM | POA: Diagnosis not present

## 2015-06-27 DIAGNOSIS — R040 Epistaxis: Secondary | ICD-10-CM | POA: Diagnosis not present

## 2015-06-27 DIAGNOSIS — J343 Hypertrophy of nasal turbinates: Secondary | ICD-10-CM | POA: Diagnosis not present

## 2015-06-29 DIAGNOSIS — M5416 Radiculopathy, lumbar region: Secondary | ICD-10-CM | POA: Diagnosis not present

## 2015-06-29 DIAGNOSIS — M5126 Other intervertebral disc displacement, lumbar region: Secondary | ICD-10-CM | POA: Diagnosis not present

## 2015-06-29 DIAGNOSIS — M961 Postlaminectomy syndrome, not elsewhere classified: Secondary | ICD-10-CM | POA: Diagnosis not present

## 2015-06-29 DIAGNOSIS — M47816 Spondylosis without myelopathy or radiculopathy, lumbar region: Secondary | ICD-10-CM | POA: Diagnosis not present

## 2015-07-04 DIAGNOSIS — E559 Vitamin D deficiency, unspecified: Secondary | ICD-10-CM | POA: Diagnosis not present

## 2015-07-04 DIAGNOSIS — E782 Mixed hyperlipidemia: Secondary | ICD-10-CM | POA: Diagnosis not present

## 2015-07-04 DIAGNOSIS — R899 Unspecified abnormal finding in specimens from other organs, systems and tissues: Secondary | ICD-10-CM | POA: Diagnosis not present

## 2015-07-04 DIAGNOSIS — R799 Abnormal finding of blood chemistry, unspecified: Secondary | ICD-10-CM | POA: Diagnosis not present

## 2015-07-05 DIAGNOSIS — M5416 Radiculopathy, lumbar region: Secondary | ICD-10-CM | POA: Diagnosis not present

## 2015-07-07 ENCOUNTER — Other Ambulatory Visit: Payer: Self-pay

## 2015-07-07 DIAGNOSIS — B009 Herpesviral infection, unspecified: Secondary | ICD-10-CM | POA: Diagnosis not present

## 2015-07-07 DIAGNOSIS — Z0000 Encounter for general adult medical examination without abnormal findings: Secondary | ICD-10-CM | POA: Diagnosis not present

## 2015-07-07 DIAGNOSIS — N951 Menopausal and female climacteric states: Secondary | ICD-10-CM | POA: Diagnosis not present

## 2015-07-07 DIAGNOSIS — Z1231 Encounter for screening mammogram for malignant neoplasm of breast: Secondary | ICD-10-CM

## 2015-07-07 DIAGNOSIS — E559 Vitamin D deficiency, unspecified: Secondary | ICD-10-CM | POA: Diagnosis not present

## 2015-07-15 DIAGNOSIS — L821 Other seborrheic keratosis: Secondary | ICD-10-CM | POA: Diagnosis not present

## 2015-07-15 DIAGNOSIS — D225 Melanocytic nevi of trunk: Secondary | ICD-10-CM | POA: Diagnosis not present

## 2015-07-15 DIAGNOSIS — L812 Freckles: Secondary | ICD-10-CM | POA: Diagnosis not present

## 2015-07-18 DIAGNOSIS — M5416 Radiculopathy, lumbar region: Secondary | ICD-10-CM | POA: Diagnosis not present

## 2015-07-18 DIAGNOSIS — M5126 Other intervertebral disc displacement, lumbar region: Secondary | ICD-10-CM | POA: Diagnosis not present

## 2015-07-18 DIAGNOSIS — M961 Postlaminectomy syndrome, not elsewhere classified: Secondary | ICD-10-CM | POA: Diagnosis not present

## 2015-07-22 ENCOUNTER — Ambulatory Visit: Payer: BLUE CROSS/BLUE SHIELD

## 2015-07-22 DIAGNOSIS — M5137 Other intervertebral disc degeneration, lumbosacral region: Secondary | ICD-10-CM | POA: Diagnosis not present

## 2015-07-22 DIAGNOSIS — M9903 Segmental and somatic dysfunction of lumbar region: Secondary | ICD-10-CM | POA: Diagnosis not present

## 2015-07-22 DIAGNOSIS — M5417 Radiculopathy, lumbosacral region: Secondary | ICD-10-CM | POA: Diagnosis not present

## 2015-07-22 DIAGNOSIS — M9905 Segmental and somatic dysfunction of pelvic region: Secondary | ICD-10-CM | POA: Diagnosis not present

## 2015-07-25 DIAGNOSIS — M5137 Other intervertebral disc degeneration, lumbosacral region: Secondary | ICD-10-CM | POA: Diagnosis not present

## 2015-07-25 DIAGNOSIS — M9905 Segmental and somatic dysfunction of pelvic region: Secondary | ICD-10-CM | POA: Diagnosis not present

## 2015-07-25 DIAGNOSIS — M9903 Segmental and somatic dysfunction of lumbar region: Secondary | ICD-10-CM | POA: Diagnosis not present

## 2015-07-25 DIAGNOSIS — M5417 Radiculopathy, lumbosacral region: Secondary | ICD-10-CM | POA: Diagnosis not present

## 2015-07-27 DIAGNOSIS — M9905 Segmental and somatic dysfunction of pelvic region: Secondary | ICD-10-CM | POA: Diagnosis not present

## 2015-07-27 DIAGNOSIS — M9903 Segmental and somatic dysfunction of lumbar region: Secondary | ICD-10-CM | POA: Diagnosis not present

## 2015-07-27 DIAGNOSIS — M5417 Radiculopathy, lumbosacral region: Secondary | ICD-10-CM | POA: Diagnosis not present

## 2015-07-27 DIAGNOSIS — M5137 Other intervertebral disc degeneration, lumbosacral region: Secondary | ICD-10-CM | POA: Diagnosis not present

## 2015-07-28 DIAGNOSIS — M5417 Radiculopathy, lumbosacral region: Secondary | ICD-10-CM | POA: Diagnosis not present

## 2015-07-28 DIAGNOSIS — M5137 Other intervertebral disc degeneration, lumbosacral region: Secondary | ICD-10-CM | POA: Diagnosis not present

## 2015-07-28 DIAGNOSIS — M9903 Segmental and somatic dysfunction of lumbar region: Secondary | ICD-10-CM | POA: Diagnosis not present

## 2015-07-28 DIAGNOSIS — M9905 Segmental and somatic dysfunction of pelvic region: Secondary | ICD-10-CM | POA: Diagnosis not present

## 2015-08-16 ENCOUNTER — Ambulatory Visit
Admission: RE | Admit: 2015-08-16 | Discharge: 2015-08-16 | Disposition: A | Discharge status: Home / Self Care | Payer: BLUE CROSS/BLUE SHIELD | Source: Ambulatory Visit

## 2015-08-16 DIAGNOSIS — Z1231 Encounter for screening mammogram for malignant neoplasm of breast: Secondary | ICD-10-CM

## 2015-08-30 DIAGNOSIS — M961 Postlaminectomy syndrome, not elsewhere classified: Secondary | ICD-10-CM | POA: Diagnosis not present

## 2015-08-30 DIAGNOSIS — Z6829 Body mass index (BMI) 29.0-29.9, adult: Secondary | ICD-10-CM | POA: Diagnosis not present

## 2015-09-06 ENCOUNTER — Other Ambulatory Visit: Payer: Self-pay | Admitting: Neurological Surgery

## 2015-09-16 ENCOUNTER — Encounter (HOSPITAL_COMMUNITY)
Admission: RE | Admit: 2015-09-16 | Discharge: 2015-09-16 | Disposition: A | Discharge status: Home / Self Care | Payer: PRIVATE HEALTH INSURANCE | Source: Ambulatory Visit | Attending: Neurological Surgery | Admitting: Neurological Surgery

## 2015-09-16 ENCOUNTER — Other Ambulatory Visit (HOSPITAL_COMMUNITY): Payer: Self-pay | Admitting: *Deleted

## 2015-09-16 ENCOUNTER — Ambulatory Visit (HOSPITAL_COMMUNITY)
Admission: RE | Admit: 2015-09-16 | Discharge: 2015-09-16 | Disposition: A | Discharge status: Home / Self Care | Payer: PRIVATE HEALTH INSURANCE | Source: Ambulatory Visit | Attending: Neurological Surgery | Admitting: Neurological Surgery

## 2015-09-16 ENCOUNTER — Encounter (HOSPITAL_COMMUNITY): Payer: Self-pay

## 2015-09-16 DIAGNOSIS — Z01812 Encounter for preprocedural laboratory examination: Secondary | ICD-10-CM | POA: Insufficient documentation

## 2015-09-16 DIAGNOSIS — M961 Postlaminectomy syndrome, not elsewhere classified: Secondary | ICD-10-CM | POA: Insufficient documentation

## 2015-09-16 DIAGNOSIS — Z0183 Encounter for blood typing: Secondary | ICD-10-CM | POA: Insufficient documentation

## 2015-09-16 DIAGNOSIS — Z01818 Encounter for other preprocedural examination: Secondary | ICD-10-CM | POA: Insufficient documentation

## 2015-09-16 LAB — BASIC METABOLIC PANEL
ANION GAP: 8 (ref 5–15)
BUN: 14 mg/dL (ref 6–20)
CO2: 24 mmol/L (ref 22–32)
Calcium: 8.8 mg/dL — ABNORMAL LOW (ref 8.9–10.3)
Chloride: 108 mmol/L (ref 101–111)
Creatinine, Ser: 0.64 mg/dL (ref 0.44–1.00)
GFR calc Af Amer: >60 mL/min (ref >60)
GLUCOSE: 90 mg/dL (ref 65–99)
POTASSIUM: 4.2 mmol/L (ref 3.5–5.1)
Sodium: 140 mmol/L (ref 135–145)

## 2015-09-16 LAB — CBC WITH DIFFERENTIAL/PLATELET
BASOS ABS: 0 10*3/uL (ref 0.0–0.1)
Basophils Relative: 0 %
Eosinophils Absolute: 0.1 10*3/uL (ref 0.0–0.7)
Eosinophils Relative: 1 %
HEMATOCRIT: 40.7 % (ref 36.0–46.0)
Hemoglobin: 13.2 g/dL (ref 12.0–15.0)
LYMPHS PCT: 27 %
Lymphs Abs: 1.8 10*3/uL (ref 0.7–4.0)
MCH: 29.8 pg (ref 26.0–34.0)
MCHC: 32.4 g/dL (ref 30.0–36.0)
MCV: 91.9 fL (ref 78.0–100.0)
MONO ABS: 0.4 10*3/uL (ref 0.1–1.0)
Monocytes Relative: 6 %
NEUTROS ABS: 4.3 10*3/uL (ref 1.7–7.7)
Neutrophils Relative %: 66 %
PLATELETS: 254 10*3/uL (ref 150–400)
RBC: 4.43 MIL/uL (ref 3.87–5.11)
RDW: 12.4 % (ref 11.5–15.5)
WBC: 6.6 10*3/uL (ref 4.0–10.5)

## 2015-09-16 LAB — TYPE AND SCREEN
ABO/RH(D): O POS
Antibody Screen: NEGATIVE

## 2015-09-16 LAB — PROTIME-INR
INR: 1.02 (ref 0.00–1.49)
PROTHROMBIN TIME: 13.6 s (ref 11.6–15.2)

## 2015-09-16 LAB — ABO/RH: ABO/RH(D): O POS

## 2015-09-16 LAB — SURGICAL PCR SCREEN
MRSA, PCR: NEGATIVE
Staphylococcus aureus: POSITIVE — AB

## 2015-09-16 NOTE — Progress Notes (Signed)
Pt. Notified of results of PCR.  Rx. Called to [facility] [address].Marland Kitchen

## 2015-09-16 NOTE — Pre-Procedure Instructions (Signed)
    [name]  09/16/2015      [id number] - [place], [place] - [address] AT [address] OF [facility] [address] [place] [address] Phone: [phone] Fax: [phone]    Your procedure is scheduled on 09-21-2015  Wednesday .  Report to [facility] Admitting at 11:00 A.M.   Call this number if you have problems the morning of surgery:  [phone]   Remember:  Do not eat food or drink liquids after midnight.   Take these medicines the morning of surgery with A SIP OF WATER Alprazolam(Xanax),Estradiol(Estrace),Morphine if needed,              STOP ASPIRIN,ANTIINFLAMATORIES (IBUPROFEN,ALEVE,MOTRIN,ADVIL,GOODY'S POWDERS),HERBAL SUPPLEMENTS,FISH OIL,AND VITAMINS 5-7 DAYS PRIOR TO SURGERY   Do not wear jewelry, make-up or nail polish.  Do not wear lotions, powders, or perfumes.  You may not wear deoderant.  Do not shave 48 hours prior to surgery.     Do not bring valuables to the hospital.  [facility] is not responsible for any belongings or valuables.  Contacts, dentures or bridgework may not be worn into surgery.  Leave your suitcase in the car.  After surgery it may be brought to your room.  For patients admitted to the hospital, discharge time will be determined by your treatment team.  Patients discharged the day of surgery will not be allowed to drive home.    Special instructions:  See attached sheet for instructions on CHG showers  Please read over the following fact sheets that you were given. MRSA Information and Surgical Site Infection Prevention

## 2015-09-21 ENCOUNTER — Encounter (HOSPITAL_COMMUNITY)
Admission: RE | Disposition: A | Discharge status: Home / Self Care | Payer: Self-pay | Source: Ambulatory Visit | Attending: Neurological Surgery

## 2015-09-21 ENCOUNTER — Inpatient Hospital Stay (HOSPITAL_COMMUNITY): Payer: PRIVATE HEALTH INSURANCE | Admitting: Anesthesiology

## 2015-09-21 ENCOUNTER — Inpatient Hospital Stay (HOSPITAL_COMMUNITY): Payer: PRIVATE HEALTH INSURANCE

## 2015-09-21 ENCOUNTER — Inpatient Hospital Stay (HOSPITAL_COMMUNITY)
Admission: RE | Admit: 2015-09-21 | Discharge: 2015-09-24 | DRG: 460 | Disposition: A | Discharge status: Home / Self Care | Payer: PRIVATE HEALTH INSURANCE | Source: Ambulatory Visit | Attending: Neurological Surgery | Admitting: Neurological Surgery

## 2015-09-21 ENCOUNTER — Encounter (HOSPITAL_COMMUNITY): Payer: Self-pay | Admitting: Anesthesiology

## 2015-09-21 DIAGNOSIS — M961 Postlaminectomy syndrome, not elsewhere classified: Secondary | ICD-10-CM | POA: Diagnosis not present

## 2015-09-21 DIAGNOSIS — F418 Other specified anxiety disorders: Secondary | ICD-10-CM | POA: Diagnosis not present

## 2015-09-21 DIAGNOSIS — G8929 Other chronic pain: Secondary | ICD-10-CM | POA: Diagnosis present

## 2015-09-21 DIAGNOSIS — R202 Paresthesia of skin: Secondary | ICD-10-CM | POA: Diagnosis present

## 2015-09-21 DIAGNOSIS — M5117 Intervertebral disc disorders with radiculopathy, lumbosacral region: Principal | ICD-10-CM | POA: Diagnosis present

## 2015-09-21 DIAGNOSIS — G4700 Insomnia, unspecified: Secondary | ICD-10-CM | POA: Diagnosis not present

## 2015-09-21 DIAGNOSIS — Z981 Arthrodesis status: Secondary | ICD-10-CM

## 2015-09-21 DIAGNOSIS — M62830 Muscle spasm of back: Secondary | ICD-10-CM | POA: Diagnosis not present

## 2015-09-21 DIAGNOSIS — M5137 Other intervertebral disc degeneration, lumbosacral region: Secondary | ICD-10-CM | POA: Diagnosis not present

## 2015-09-21 DIAGNOSIS — R200 Anesthesia of skin: Secondary | ICD-10-CM | POA: Diagnosis present

## 2015-09-21 DIAGNOSIS — M4807 Spinal stenosis, lumbosacral region: Secondary | ICD-10-CM | POA: Diagnosis present

## 2015-09-21 DIAGNOSIS — M5127 Other intervertebral disc displacement, lumbosacral region: Secondary | ICD-10-CM | POA: Diagnosis not present

## 2015-09-21 DIAGNOSIS — K219 Gastro-esophageal reflux disease without esophagitis: Secondary | ICD-10-CM | POA: Diagnosis not present

## 2015-09-21 DIAGNOSIS — Z419 Encounter for procedure for purposes other than remedying health state, unspecified: Secondary | ICD-10-CM

## 2015-09-21 DIAGNOSIS — M549 Dorsalgia, unspecified: Secondary | ICD-10-CM | POA: Diagnosis not present

## 2015-09-21 HISTORY — PX: MAXIMUM ACCESS (MAS)POSTERIOR LUMBAR INTERBODY FUSION (PLIF) 1 LEVEL: SHX6368

## 2015-09-21 SURGERY — FOR MAXIMUM ACCESS (MAS) POSTERIOR LUMBAR INTERBODY FUSION (PLIF) 1 LEVEL
Anesthesia: General | Site: Back

## 2015-09-21 MED ORDER — VANCOMYCIN HCL 1000 MG IV SOLR
INTRAVENOUS | Status: DC | PRN
  Administered 2015-09-21: 1000 mg

## 2015-09-21 MED ORDER — MIDAZOLAM HCL 2 MG/2ML IJ SOLN
INTRAMUSCULAR | Status: AC
  Filled 2015-09-21: qty 2

## 2015-09-21 MED ORDER — PHENOL 1.4 % MT LIQD: 1.0000 | OROMUCOSAL | Status: DC | PRN

## 2015-09-21 MED ORDER — ONDANSETRON HCL 4 MG/2ML IJ SOLN
INTRAMUSCULAR | Status: DC | PRN
  Administered 2015-09-21: 4 mg via INTRAVENOUS

## 2015-09-21 MED ORDER — SODIUM CHLORIDE 0.9% FLUSH: 3.0000 mL | INTRAVENOUS | Status: DC | PRN

## 2015-09-21 MED ORDER — HEMOSTATIC AGENTS (NO CHARGE) OPTIME
TOPICAL | Status: DC | PRN
  Administered 2015-09-21: 1 via TOPICAL

## 2015-09-21 MED ORDER — SODIUM CHLORIDE 0.9% FLUSH
3.0000 mL | Freq: Two times a day (BID) | INTRAVENOUS | Status: DC
  Administered 2015-09-22 – 2015-09-23 (×4): 3 mL via INTRAVENOUS

## 2015-09-21 MED ORDER — MORPHINE SULFATE 15 MG PO TABS
15.0000 mg | ORAL_TABLET | Freq: Three times a day (TID) | ORAL | Status: DC | PRN
  Administered 2015-09-21: 15 mg via ORAL
  Filled 2015-09-21: qty 1

## 2015-09-21 MED ORDER — PROMETHAZINE HCL 25 MG/ML IJ SOLN: 6.2500 mg | INTRAMUSCULAR | Status: DC | PRN

## 2015-09-21 MED ORDER — MORPHINE SULFATE 15 MG PO TABS
15.0000 mg | ORAL_TABLET | ORAL | Status: DC | PRN
  Administered 2015-09-21 – 2015-09-22 (×4): 15 mg via ORAL
  Filled 2015-09-21 (×4): qty 1

## 2015-09-21 MED ORDER — ESTRADIOL 1 MG PO TABS
0.5000 mg | ORAL_TABLET | Freq: Once | ORAL | Status: AC
  Administered 2015-09-21: 0.5 mg via ORAL
  Filled 2015-09-21: qty 0.5

## 2015-09-21 MED ORDER — MORPHINE SULFATE (PF) 4 MG/ML IV SOLN
INTRAVENOUS | Status: AC
  Filled 2015-09-21: qty 1

## 2015-09-21 MED ORDER — SUCCINYLCHOLINE CHLORIDE 200 MG/10ML IV SOSY
PREFILLED_SYRINGE | INTRAVENOUS | Status: AC
  Filled 2015-09-21: qty 10

## 2015-09-21 MED ORDER — ACETAMINOPHEN 10 MG/ML IV SOLN
1000.0000 mg | Freq: Once | INTRAVENOUS | Status: DC
Start: 2015-09-21 — End: 2015-09-21

## 2015-09-21 MED ORDER — MIDAZOLAM HCL 2 MG/2ML IJ SOLN
1.0000 mg | INTRAMUSCULAR | Status: DC | PRN
  Administered 2015-09-21: 2 mg via INTRAVENOUS

## 2015-09-21 MED ORDER — ONDANSETRON HCL 4 MG/2ML IJ SOLN
INTRAMUSCULAR | Status: AC
  Filled 2015-09-21: qty 2

## 2015-09-21 MED ORDER — MORPHINE SULFATE (PF) 2 MG/ML IV SOLN
1.0000 mg | INTRAVENOUS | Status: DC | PRN
  Administered 2015-09-21 – 2015-09-22 (×2): 4 mg via INTRAVENOUS
  Administered 2015-09-22: 2 mg via INTRAVENOUS
  Filled 2015-09-21: qty 1
  Filled 2015-09-21: qty 2

## 2015-09-21 MED ORDER — CEFAZOLIN SODIUM-DEXTROSE 2-4 GM/100ML-% IV SOLN
2.0000 g | INTRAVENOUS | Status: AC
  Administered 2015-09-21: 2 g via INTRAVENOUS
  Filled 2015-09-21: qty 100

## 2015-09-21 MED ORDER — ESTRADIOL 0.5 MG PO TABS
0.2500 mg | ORAL_TABLET | ORAL | Status: DC
Start: 2015-09-21 — End: 2015-09-21

## 2015-09-21 MED ORDER — ZOLPIDEM TARTRATE 5 MG PO TABS
5.0000 mg | ORAL_TABLET | Freq: Every day | ORAL | Status: DC
  Administered 2015-09-21 – 2015-09-23 (×3): 5 mg via ORAL
  Filled 2015-09-21 (×3): qty 1

## 2015-09-21 MED ORDER — ALPRAZOLAM 0.5 MG PO TABS
2.0000 mg | ORAL_TABLET | Freq: Two times a day (BID) | ORAL | Status: DC | PRN
  Administered 2015-09-21: 2 mg via ORAL
  Filled 2015-09-21: qty 4

## 2015-09-21 MED ORDER — FENTANYL CITRATE (PF) 250 MCG/5ML IJ SOLN
INTRAMUSCULAR | Status: AC
  Filled 2015-09-21: qty 5

## 2015-09-21 MED ORDER — THROMBIN 20000 UNITS EX KIT
PACK | CUTANEOUS | Status: DC | PRN
  Administered 2015-09-21: 20000 [IU] via TOPICAL

## 2015-09-21 MED ORDER — PROPOFOL 10 MG/ML IV BOLUS
INTRAVENOUS | Status: AC
  Filled 2015-09-21: qty 20

## 2015-09-21 MED ORDER — LACTATED RINGERS IV SOLN
INTRAVENOUS | Status: DC
  Administered 2015-09-21 (×2): via INTRAVENOUS

## 2015-09-21 MED ORDER — SODIUM CHLORIDE 0.9 % IV SOLN: 250.0000 mL | INTRAVENOUS | Status: DC

## 2015-09-21 MED ORDER — ACETAMINOPHEN 10 MG/ML IV SOLN
INTRAVENOUS | Status: AC
  Filled 2015-09-21: qty 100

## 2015-09-21 MED ORDER — ACETAMINOPHEN 650 MG RE SUPP: 650.0000 mg | RECTAL | Status: DC | PRN

## 2015-09-21 MED ORDER — ACETAMINOPHEN 325 MG PO TABS
650.0000 mg | ORAL_TABLET | ORAL | Status: DC | PRN
  Administered 2015-09-23: 650 mg via ORAL
  Filled 2015-09-21: qty 2

## 2015-09-21 MED ORDER — KETAMINE HCL 100 MG/ML IJ SOLN
INTRAMUSCULAR | Status: AC
  Filled 2015-09-21: qty 1

## 2015-09-21 MED ORDER — CHLORHEXIDINE GLUCONATE CLOTH 2 % EX PADS: 6.0000 | MEDICATED_PAD | Freq: Once | CUTANEOUS | Status: DC

## 2015-09-21 MED ORDER — SODIUM CHLORIDE 0.9 % IR SOLN
Status: DC | PRN
  Administered 2015-09-21: 15:00:00

## 2015-09-21 MED ORDER — LIDOCAINE HCL (CARDIAC) 20 MG/ML IV SOLN
INTRAVENOUS | Status: DC | PRN
  Administered 2015-09-21: 75 mg via INTRAVENOUS

## 2015-09-21 MED ORDER — FENTANYL CITRATE (PF) 250 MCG/5ML IJ SOLN
INTRAMUSCULAR | Status: DC | PRN
  Administered 2015-09-21 (×3): 50 ug via INTRAVENOUS
  Administered 2015-09-21 (×2): 100 ug via INTRAVENOUS
  Administered 2015-09-21: 150 ug via INTRAVENOUS

## 2015-09-21 MED ORDER — MIDAZOLAM HCL 2 MG/2ML IJ SOLN
INTRAMUSCULAR | Status: DC | PRN
  Administered 2015-09-21 (×3): 2 mg via INTRAVENOUS

## 2015-09-21 MED ORDER — MENTHOL 3 MG MT LOZG: 1.0000 | LOZENGE | OROMUCOSAL | Status: DC | PRN

## 2015-09-21 MED ORDER — ACETAMINOPHEN 10 MG/ML IV SOLN
1000.0000 mg | Freq: Four times a day (QID) | INTRAVENOUS | Status: DC
  Administered 2015-09-21: 1000 mg via INTRAVENOUS

## 2015-09-21 MED ORDER — SENNA 8.6 MG PO TABS
1.0000 | ORAL_TABLET | Freq: Two times a day (BID) | ORAL | Status: DC
  Administered 2015-09-21 – 2015-09-24 (×6): 8.6 mg via ORAL
  Filled 2015-09-21 (×6): qty 1

## 2015-09-21 MED ORDER — HYDROMORPHONE HCL 1 MG/ML IJ SOLN
INTRAMUSCULAR | Status: AC
  Administered 2015-09-21: 0.5 mg
  Filled 2015-09-21: qty 1

## 2015-09-21 MED ORDER — HYDROMORPHONE HCL 1 MG/ML IJ SOLN
0.2500 mg | INTRAMUSCULAR | Status: DC | PRN
  Administered 2015-09-21 (×3): 0.5 mg via INTRAVENOUS

## 2015-09-21 MED ORDER — METHOCARBAMOL 1000 MG/10ML IJ SOLN
500.0000 mg | Freq: Four times a day (QID) | INTRAVENOUS | Status: DC | PRN
  Filled 2015-09-21: qty 5

## 2015-09-21 MED ORDER — PROPOFOL 10 MG/ML IV BOLUS
INTRAVENOUS | Status: DC | PRN
  Administered 2015-09-21: 160 mg via INTRAVENOUS

## 2015-09-21 MED ORDER — CEFAZOLIN IN D5W 1 GM/50ML IV SOLN
1.0000 g | Freq: Three times a day (TID) | INTRAVENOUS | Status: AC
  Administered 2015-09-21 – 2015-09-22 (×2): 1 g via INTRAVENOUS
  Filled 2015-09-21: qty 50

## 2015-09-21 MED ORDER — SUCCINYLCHOLINE CHLORIDE 20 MG/ML IJ SOLN
INTRAMUSCULAR | Status: DC | PRN
Start: 2015-09-21 — End: 2015-09-21
  Administered 2015-09-21: 100 mg via INTRAVENOUS

## 2015-09-21 MED ORDER — 0.9 % SODIUM CHLORIDE (POUR BTL) OPTIME
TOPICAL | Status: DC | PRN
  Administered 2015-09-21: 1000 mL

## 2015-09-21 MED ORDER — THROMBIN 5000 UNITS EX SOLR
CUTANEOUS | Status: DC | PRN
  Administered 2015-09-21: 5000 [IU] via TOPICAL

## 2015-09-21 MED ORDER — DEXAMETHASONE SODIUM PHOSPHATE 10 MG/ML IJ SOLN
10.0000 mg | INTRAMUSCULAR | Status: AC
  Administered 2015-09-21: 10 mg via INTRAVENOUS
  Filled 2015-09-21: qty 1

## 2015-09-21 MED ORDER — POTASSIUM CHLORIDE IN NACL 20-0.9 MEQ/L-% IV SOLN
INTRAVENOUS | Status: DC
Start: 2015-09-21 — End: 2015-09-24
  Filled 2015-09-21 (×6): qty 1000

## 2015-09-21 MED ORDER — VANCOMYCIN HCL 1000 MG IV SOLR
INTRAVENOUS | Status: AC
  Filled 2015-09-21: qty 1000

## 2015-09-21 MED ORDER — CELECOXIB 200 MG PO CAPS
200.0000 mg | ORAL_CAPSULE | Freq: Two times a day (BID) | ORAL | Status: DC
  Administered 2015-09-21 – 2015-09-22 (×2): 200 mg via ORAL
  Filled 2015-09-21 (×2): qty 1

## 2015-09-21 MED ORDER — ONDANSETRON HCL 4 MG/2ML IJ SOLN
4.0000 mg | INTRAMUSCULAR | Status: DC | PRN
  Administered 2015-09-22: 4 mg via INTRAVENOUS
  Filled 2015-09-21: qty 2

## 2015-09-21 MED ORDER — PROPOFOL 500 MG/50ML IV EMUL
INTRAVENOUS | Status: DC | PRN
  Administered 2015-09-21: 50 ug/kg/min via INTRAVENOUS

## 2015-09-21 MED ORDER — BUPIVACAINE HCL (PF) 0.25 % IJ SOLN
INTRAMUSCULAR | Status: DC | PRN
  Administered 2015-09-21: 4 mL

## 2015-09-21 MED ORDER — LIDOCAINE 2% (20 MG/ML) 5 ML SYRINGE
INTRAMUSCULAR | Status: AC
  Filled 2015-09-21: qty 5

## 2015-09-21 MED ORDER — METHOCARBAMOL 500 MG PO TABS
500.0000 mg | ORAL_TABLET | Freq: Four times a day (QID) | ORAL | Status: DC | PRN
  Administered 2015-09-21 – 2015-09-22 (×3): 500 mg via ORAL
  Filled 2015-09-21 (×3): qty 1

## 2015-09-21 MED ORDER — HYDROMORPHONE HCL 1 MG/ML IJ SOLN
INTRAMUSCULAR | Status: AC
  Filled 2015-09-21: qty 1

## 2015-09-21 MED ORDER — MEPERIDINE HCL 25 MG/ML IJ SOLN: 6.2500 mg | INTRAMUSCULAR | Status: DC | PRN

## 2015-09-21 SURGICAL SUPPLY — 57 items
BAG DECANTER FOR FLEXI CONT (MISCELLANEOUS) ×2 IMPLANT
BENZOIN TINCTURE PRP APPL 2/3 (GAUZE/BANDAGES/DRESSINGS) ×2 IMPLANT
BIT DRILL PLIF MAS 5.0MM DISP (DRILL) ×1 IMPLANT
BLADE CLIPPER SURG (BLADE) IMPLANT
BONE MATRIX OSTEOCEL PRO SM (Bone Implant) ×4 IMPLANT
BUR MATCHSTICK NEURO 3.0 LAGG (BURR) ×2 IMPLANT
CAGE COROENT MP 8X23 (Cage) ×4 IMPLANT
CANISTER SUCT 3000ML PPV (MISCELLANEOUS) ×2 IMPLANT
CAP RELINE MOD TULIP RMM (Cap) ×4 IMPLANT
CLIP NEUROVISION LG (CLIP) ×2 IMPLANT
CONT SPEC 4OZ CLIKSEAL STRL BL (MISCELLANEOUS) ×2 IMPLANT
COVER BACK TABLE 24X17X13 BIG (DRAPES) ×2 IMPLANT
COVER BACK TABLE 60X90IN (DRAPES) ×2 IMPLANT
DRAPE C-ARM 42X72 X-RAY (DRAPES) ×2 IMPLANT
DRAPE C-ARMOR (DRAPES) ×2 IMPLANT
DRAPE LAPAROTOMY 100X72X124 (DRAPES) ×2 IMPLANT
DRAPE POUCH INSTRU U-SHP 10X18 (DRAPES) ×2 IMPLANT
DRAPE SURG 17X23 STRL (DRAPES) ×2 IMPLANT
DRILL PLIF MAS 5.0MM DISP (DRILL) ×2
DRSG OPSITE POSTOP 4X6 (GAUZE/BANDAGES/DRESSINGS) ×2 IMPLANT
DURAPREP 26ML APPLICATOR (WOUND CARE) ×2 IMPLANT
ELECT REM PT RETURN 9FT ADLT (ELECTROSURGICAL) ×2
ELECTRODE REM PT RTRN 9FT ADLT (ELECTROSURGICAL) ×1 IMPLANT
EVACUATOR 1/8 PVC DRAIN (DRAIN) ×2 IMPLANT
GAUZE SPONGE 4X4 16PLY XRAY LF (GAUZE/BANDAGES/DRESSINGS) IMPLANT
GLOVE BIO SURGEON STRL SZ8 (GLOVE) ×4 IMPLANT
GOWN STRL REUS W/ TWL LRG LVL3 (GOWN DISPOSABLE) IMPLANT
GOWN STRL REUS W/ TWL XL LVL3 (GOWN DISPOSABLE) ×2 IMPLANT
GOWN STRL REUS W/TWL 2XL LVL3 (GOWN DISPOSABLE) IMPLANT
GOWN STRL REUS W/TWL LRG LVL3 (GOWN DISPOSABLE)
GOWN STRL REUS W/TWL XL LVL3 (GOWN DISPOSABLE) ×2
HEMOSTAT POWDER KIT SURGIFOAM (HEMOSTASIS) ×2 IMPLANT
KIT BASIN OR (CUSTOM PROCEDURE TRAY) ×2 IMPLANT
KIT ROOM TURNOVER OR (KITS) ×2 IMPLANT
MILL MEDIUM DISP (BLADE) ×2 IMPLANT
MODULE NVM5 NEXT GEN EMG (NEEDLE) ×2 IMPLANT
NEEDLE HYPO 25X1 1.5 SAFETY (NEEDLE) ×2 IMPLANT
NS IRRIG 1000ML POUR BTL (IV SOLUTION) ×2 IMPLANT
PACK LAMINECTOMY NEURO (CUSTOM PROCEDURE TRAY) ×2 IMPLANT
PAD ARMBOARD 7.5X6 YLW CONV (MISCELLANEOUS) ×6 IMPLANT
ROD RELINE COCR LORD 5.0X35 (Rod) ×4 IMPLANT
SCREW LOCK RSS 4.5/5.0MM (Screw) ×8 IMPLANT
SCREW RELINE RMM 6.5X35 4S (Screw) ×4 IMPLANT
SCREW SHANK RELINE MOD 5.0X30 (Screw) ×4 IMPLANT
SPONGE LAP 4X18 X RAY DECT (DISPOSABLE) IMPLANT
SPONGE SURGIFOAM ABS GEL 100 (HEMOSTASIS) ×2 IMPLANT
STRIP CLOSURE SKIN 1/2X4 (GAUZE/BANDAGES/DRESSINGS) ×4 IMPLANT
SUT VIC AB 0 CT1 18XCR BRD8 (SUTURE) ×1 IMPLANT
SUT VIC AB 0 CT1 8-18 (SUTURE) ×1
SUT VIC AB 2-0 CP2 18 (SUTURE) ×2 IMPLANT
SUT VIC AB 3-0 SH 8-18 (SUTURE) ×4 IMPLANT
SYR 3ML LL SCALE MARK (SYRINGE) IMPLANT
TOWEL OR 17X24 6PK STRL BLUE (TOWEL DISPOSABLE) ×2 IMPLANT
TOWEL OR 17X26 10 PK STRL BLUE (TOWEL DISPOSABLE) ×2 IMPLANT
TRAP SPECIMEN MUCOUS 40CC (MISCELLANEOUS) ×2 IMPLANT
TRAY FOLEY W/METER SILVER 16FR (SET/KITS/TRAYS/PACK) ×2 IMPLANT
WATER STERILE IRR 1000ML POUR (IV SOLUTION) ×2 IMPLANT

## 2015-09-21 NOTE — Anesthesia Procedure Notes (Addendum)
Procedure Name: Intubation Date/Time: 09/21/2015 2:04 PM Performed by: Jefm Miles E Pre-anesthesia Checklist: Patient identified, Emergency Drugs available, Suction available and Patient being monitored Patient Re-evaluated:Patient Re-evaluated prior to inductionOxygen Delivery Method: Circle System Utilized Preoxygenation: Pre-oxygenation with 100% oxygen Intubation Type: IV induction Ventilation: Mask ventilation without difficulty Laryngoscope Size: Mac and 3 Grade View: Grade II Tube type: Oral Tube size: 7.0 mm Number of attempts: 1 Airway Equipment and Method: Stylet Placement Confirmation: ETT inserted through vocal cords under direct vision,  positive ETCO2 and breath sounds checked- equal and bilateral Secured at: 21 cm Tube secured with: Tape Dental Injury: Teeth and Oropharynx as per pre-operative assessment

## 2015-09-21 NOTE — Op Note (Signed)
09/21/2015  4:49 PM  PATIENT:  [name]  55 y.o. female  PRE-OPERATIVE DIAGNOSIS:  Recurrent disc herniation and degenerative disc disease L5-S1 with back and leg pain  POST-OPERATIVE DIAGNOSIS:  Same  PROCEDURE:   1. Decompressive lumbar laminectomy L5-S1 requiring more work than would be required for a simple exposure of the disk for PLIF in order to adequately decompress the neural elements and address the spinal stenosis 2. Posterior lumbar interbody fusion L5-S1 using PEEK interbody cages packed with morcellized allograft and autograft 3. Posterior fixation L5-S1 using cortical pedicle screws.   SURGEON:  Marikay Alar, MD  ASSISTANTS: Dr. Mikal Plane  ANESTHESIA:  General  EBL: 100 ml  Total I/O In: 1000 [I.V.:1000] Out: 450 [Urine:450]  BLOOD ADMINISTERED:none  DRAINS: None  INDICATION FOR PROCEDURE: This patient underwent 2 previous bilateral L5-S1 microdiscectomies. She presented with chronic back pain with bilateral leg pain with degenerative disc disease and a recurrent midline disc herniation L5-S1. She tried medical management without relief. Her pain was debilitating. I recommended decompression and instrumented fusion. Patient understood the risks, benefits, and alternatives and potential outcomes and wished to proceed.  PROCEDURE DETAILS:  The patient was brought to the operating room. After induction of generalized endotracheal anesthesia the patient was rolled into the prone position on chest rolls and all pressure points were padded. The patient's lumbar region was cleaned and then prepped with DuraPrep and draped in the usual sterile fashion. Anesthesia was injected and then a dorsal midline incision was made and carried down to the lumbosacral fascia. The fascia was opened and the paraspinous musculature was taken down in a subperiosteal fashion to expose L5-S1. A self-retaining retractor was placed. Intraoperative fluoroscopy confirmed my level, and I started with  placement of the L5 cortical pedicle screws. The pedicle screw entry zones were identified utilizing surface landmarks and  AP and lateral fluoroscopy. I scored the cortex with the high-speed drill and then used the hand drill and EMG monitoring to drill an upward and outward direction into the pedicle. I then tapped line to line, and the tap was also monitored. I then placed a 5.0 x 35 mm cortical pedicle screw into the pedicles of L5 bilaterally. I then turned my attention to the decompression and the spinous process was removed and complete lumbar laminectomies, hemi- facetectomies, and foraminotomies were performed at L5-S1. The patient had significant spinal stenosis and this required more work than would be required for a simple exposure of the disc for posterior lumbar interbody fusion. Much more generous decompression was undertaken in order to adequately decompress the neural elements and address the patient's leg pain. The yellow ligament was removed to expose the underlying dura and nerve roots, and generous foraminotomies were performed to adequately decompress the neural elements. Both the exiting and traversing nerve roots were decompressed on both sides until a coronary dilator passed easily along the nerve roots. Once the decompression was complete, I turned my attention to the posterior lower lumbar interbody fusion. The epidural venous vasculature was coagulated and cut sharply. Disc space was incised and the initial discectomy was performed with pituitary rongeurs. The disc space was distracted with sequential distractors to a height of 8 mm. We then used a series of scrapers and shavers to prepare the endplates for fusion. The midline was prepared with Epstein curettes. Once the complete discectomy was finished, we packed an appropriate sized peek interbody cage with local autograft and morcellized allograft, gently retracted the nerve root, and tapped the cage into  position at L5-S1.  The  midline between the cages was packed with morselized autograft and allograft. We then turned our attention to the placement of the lower pedicle screws. The pedicle screw entry zones were identified utilizing surface landmarks and fluoroscopy. I drilled into each pedicle utilizing the hand drill and EMG monitoring, and tapped each pedicle with the appropriate tap. We palpated with a ball probe to assure no break in the cortex. We then placed 6.5 x 35 mm cortical pedicle screws into the pedicles bilaterally at S1. We then placed lordotic rods into the multiaxial screw heads of the pedicle screws and locked these in position with the locking caps and anti-torque device. We then checked our construct with AP and lateral fluoroscopy. Irrigated with copious amounts of bacitracin-containing saline solution. Placed a medium Hemovac drain through separate stab incision. Inspected the nerve roots once again to assure adequate decompression, lined to the dura with Gelfoam, and closed the muscle and the fascia with 0 Vicryl. Closed the subcutaneous tissues with 2-0 Vicryl and subcuticular tissues with 3-0 Vicryl. The skin was closed with benzoin and Steri-Strips. Dressing was then applied, the patient was awakened from general anesthesia and transported to the recovery room in stable condition. At the end of the procedure all sponge, needle and instrument counts were correct.   PLAN OF CARE: Admit to inpatient   PATIENT DISPOSITION:  PACU - hemodynamically stable.   Delay start of Pharmacological VTE agent (>24hrs) due to surgical blood loss or risk of bleeding:  yes

## 2015-09-21 NOTE — H&P (Signed)
Subjective: Patient is a 55 y.o. female admitted for PLIF L5-S1. Status post previous bilateral microdiscectomy L5-S1 2. Onset of symptoms was several months ago, gradually worsening since that time.  The pain is rated severe, and is located at the across the lower back and radiates to her legs. The pain is described as aching and occurs all day. The symptoms have been progressive. Symptoms are exacerbated by exercise. 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 Ulcer   . Depression with anxiety     r/t death of mother  . Shortness of breath     JUST WHEN I MOVE - RELATED TO MY BACK PAIN  . Insomnia     takes Xanax nightly as needed as well as Ambien  . Depression     takes Wellbutrin daily  . Constipation     takes Colace daily as needed  . Muscle spasm     takes Robaxin daily as needed  . History of bronchitis yrs ago  . Dizziness   . Weakness     numbness and tingling in right leg  . Back pain     herniated disc  . Vitamin D deficiency     takes Vit D weekly  . GERD (gastroesophageal reflux disease)     was taking Prilosec but has been off 2-3 wks  . Urinary frequency   . Nocturia   . Anxiety     takes Xanax as needed  . Headache(784.0)     occasionally  . Arthritis   . DDD (degenerative disc disease)     Past Surgical History  Procedure Laterality Date  . Ovarian cyst surgery    . Shoulder arthroscopy with rotator cuff repair Right   . Lumbar laminectomy/decompression microdiscectomy N/A 08/26/2013    Procedure: MICRO LUMBER DECOMPRESSION L5-S1 BILATERAL;  Surgeon: Javier Docker, MD;  Location: [facility];  Service: Orthopedics;  Laterality: N/A;  . Exploratory laparotomy    . Colonoscopy    . Lumbar disc surgery  10/14/2013    revision L5-S1  . Back surgery    . Tubal ligation  1980's  . Abdominal hysterectomy    . Lumbar laminectomy/decompression microdiscectomy Right 10/14/2013    Procedure: REVISION RIGHT L5-S1  DISCECTOMY ;  Surgeon: Venita Lick, MD;  Location: [facility];  Service: Orthopedics;  Laterality: Right;  . Colonoscopy with propofol N/A 11/04/2014    Procedure: COLONOSCOPY WITH PROPOFOL;  Surgeon: Charolett Bumpers, MD;  Location: [facility];  Service: Endoscopy;  Laterality: N/A;    Prior to Admission medications   Medication Sig Start Date End Date Taking? Authorizing Provider  alprazolam Prudy Feeler) 2 MG tablet Take 2 mg by mouth 2 (two) times daily as needed for anxiety.    Yes Historical Provider, MD  estradiol (ESTRACE) 0.5 MG tablet Take 0.25-0.5 mg by mouth See admin instructions. 1/2 tab in the morning and 1 tab at night   Yes Historical Provider, MD  Halcinonide (HALOG) 0.1 % CREA Apply 1 application topically 3 (three) times daily as needed (for eczema).   Yes Historical Provider, MD  morphine (MSIR) 15 MG tablet Take 15 mg by mouth 3 (three) times daily as needed for moderate pain.  08/17/15  Yes Historical Provider, MD  Vitamin D, Ergocalciferol, (DRISDOL) 50000 units CAPS capsule Take 50,000 Units by mouth 2 (two) times a week. 07/07/15  Yes Historical Provider, MD  zolpidem (AMBIEN) 10 MG tablet Take 10 mg by  mouth at bedtime.  08/06/13  Yes Historical Provider, MD   Allergies  Allergen Reactions  . Nabumetone Diarrhea    Social History  Substance Use Topics  . Smoking status: Never Smoker   . Smokeless tobacco: Never Used  . Alcohol Use: No    Family History  Problem Relation Age of Onset  . COPD Father   . Transient ischemic attack Mother     multiple     Review of Systems  Positive ROS: Negative  All other systems have been reviewed and were otherwise negative with the exception of those mentioned in the HPI and as above.  Objective: Vital signs in last 24 hours: Temp:  [97.6 F (36.4 C)] 97.6 F (36.4 C) (07/12 1120) Pulse Rate:  [90] 90 (07/12 1120) Resp:  [18] 18 (07/12 1120) BP: (121)/(67) 121/67 mmHg (07/12 1120) SpO2:  [98 %] 98 % (07/12 1120) Weight:   [82.918 kg (182 lb 12.8 oz)] 82.918 kg (182 lb 12.8 oz) (07/12 1120)  General Appearance: Alert, cooperative, no distress, appears stated age Head: Normocephalic, without obvious abnormality, atraumatic Eyes: PERRL, conjunctiva/corneas clear, EOM's intact    Neck: Supple, symmetrical, trachea midline Back: Symmetric, no curvature, ROM normal, no CVA tenderness Lungs:  respirations unlabored Heart: Regular rate and rhythm Abdomen: Soft, non-tender Extremities: Extremities normal, atraumatic, no cyanosis or edema Pulses: 2+ and symmetric all extremities Skin: Skin color, texture, turgor normal, no rashes or lesions  NEUROLOGIC:   Mental status: Alert and oriented x4,  no aphasia, good attention span, fund of knowledge, and memory Motor Exam - grossly normal Sensory Exam - grossly normal Reflexes: 1+ Coordination - grossly normal Gait - grossly normal Balance - grossly normal Cranial Nerves: I: smell Not tested  II: visual acuity  OS: nl    OD: nl  II: visual fields Full to confrontation  II: pupils Equal, round, reactive to light  III,VII: ptosis None  III,IV,VI: extraocular muscles  Full ROM  V: mastication Normal  V: facial light touch sensation  Normal  V,VII: corneal reflex  Present  VII: facial muscle function - upper  Normal  VII: facial muscle function - lower Normal  VIII: hearing Not tested  IX: soft palate elevation  Normal  IX,X: gag reflex Present  XI: trapezius strength  5/5  XI: sternocleidomastoid strength 5/5  XI: neck flexion strength  5/5  XII: tongue strength  Normal    Data Review Lab Results  Component Value Date   WBC 6.6 09/16/2015   HGB 13.2 09/16/2015   HCT 40.7 09/16/2015   MCV 91.9 09/16/2015   PLT 254 09/16/2015   Lab Results  Component Value Date   NA 140 09/16/2015   K 4.2 09/16/2015   CL 108 09/16/2015   CO2 24 09/16/2015   BUN 14 09/16/2015   CREATININE 0.64 09/16/2015   GLUCOSE 90 09/16/2015   Lab Results  Component Value  Date   INR 1.02 09/16/2015    Assessment/Plan: Patient admitted for PLIF L5-S1. Patient has failed a reasonable attempt at conservative therapy.  I explained the condition and procedure to the patient and answered any questions.  Patient wishes to proceed with procedure as planned. Understands risks/ benefits and typical outcomes of procedure.   [name] S 09/21/2015 1:21 PM

## 2015-09-21 NOTE — Transfer of Care (Signed)
Immediate Anesthesia Transfer of Care Note  Patient: [name]  Procedure(s) Performed: Procedure(s): L5/S1 MAXIMUM ACCESS (MAS) POSTERIOR LUMBAR INTERBODY FUSION (PLIF) (N/A)  Patient Location: PACU  Anesthesia Type:General  Level of Consciousness: awake  Airway & Oxygen Therapy: Patient Spontanous Breathing and Patient connected to nasal cannula oxygen  Post-op Assessment: Report given to RN and Patient moving all extremities X 4  Post vital signs: Reviewed and stable  Last Vitals:  Filed Vitals:   09/21/15 1120 09/21/15 1648  BP: 121/67   Pulse: 90   Temp: 36.4 C 36.5 C  Resp: 18     Last Pain:  Filed Vitals:   09/21/15 1652  PainSc: 10-Worst pain ever      Patients Stated Pain Goal: 1 (09/21/15 1140)  Complications: No apparent anesthesia complications

## 2015-09-21 NOTE — Anesthesia Preprocedure Evaluation (Signed)
Anesthesia Evaluation  Patient identified by MRN, date of birth, ID band Patient awake    Reviewed: Allergy & Precautions, NPO status , Patient's Chart, lab work & pertinent test results  History of Anesthesia Complications Negative for: history of anesthetic complications  Airway Mallampati: II  TM Distance: >3 FB Neck ROM: Full    Dental  (+) Teeth Intact, Dental Advisory Given   Pulmonary shortness of breath,    breath sounds clear to auscultation       Cardiovascular negative cardio ROS   Rhythm:Regular Rate:Normal     Neuro/Psych    GI/Hepatic negative GI ROS, Neg liver ROS,   Endo/Other  negative endocrine ROS  Renal/GU negative Renal ROS     Musculoskeletal  (+) Arthritis ,   Abdominal   Peds  Hematology negative hematology ROS (+)   Anesthesia Other Findings   Reproductive/Obstetrics                             Anesthesia Physical Anesthesia Plan  ASA: I  Anesthesia Plan: General   Post-op Pain Management:    Induction: Intravenous  Airway Management Planned: Oral ETT  Additional Equipment:   Intra-op Plan:   Post-operative Plan: Extubation in OR  Informed Consent: I have reviewed the patients History and Physical, chart, labs and discussed the procedure including the risks, benefits and alternatives for the proposed anesthesia with the patient or authorized representative who has indicated his/her understanding and acceptance.   Dental advisory given  Plan Discussed with: CRNA and Surgeon  Anesthesia Plan Comments:         Anesthesia Quick Evaluation

## 2015-09-22 MED ORDER — ESTRADIOL 1 MG PO TABS
0.5000 mg | ORAL_TABLET | Freq: Every day | ORAL | Status: DC
  Administered 2015-09-22 – 2015-09-24 (×3): 0.5 mg via ORAL
  Filled 2015-09-22 (×3): qty 0.5

## 2015-09-22 MED ORDER — DIAZEPAM 5 MG PO TABS
5.0000 mg | ORAL_TABLET | Freq: Four times a day (QID) | ORAL | Status: DC | PRN
  Administered 2015-09-22 – 2015-09-24 (×8): 5 mg via ORAL
  Filled 2015-09-22 (×8): qty 1

## 2015-09-22 MED ORDER — OXYCODONE HCL 5 MG PO TABS
20.0000 mg | ORAL_TABLET | ORAL | Status: DC | PRN
  Administered 2015-09-22 – 2015-09-24 (×11): 20 mg via ORAL
  Filled 2015-09-22 (×11): qty 4

## 2015-09-22 MED ORDER — KETOROLAC TROMETHAMINE 30 MG/ML IJ SOLN
30.0000 mg | Freq: Four times a day (QID) | INTRAMUSCULAR | Status: DC | PRN
  Administered 2015-09-22 – 2015-09-24 (×7): 30 mg via INTRAVENOUS
  Filled 2015-09-22 (×7): qty 1

## 2015-09-22 MED ORDER — ESTRADIOL 1 MG PO TABS
0.5000 mg | ORAL_TABLET | Freq: Once | ORAL | Status: AC
  Administered 2015-09-22: 0.5 mg via ORAL
  Filled 2015-09-22: qty 0.5

## 2015-09-22 NOTE — Anesthesia Postprocedure Evaluation (Signed)
Anesthesia Post Note  Patient: [name]  Procedure(s) Performed: Procedure(s) (LRB): L5/S1 MAXIMUM ACCESS (MAS) POSTERIOR LUMBAR INTERBODY FUSION (PLIF) (N/A)  Patient location during evaluation: PACU Anesthesia Type: General Level of consciousness: sedated and patient cooperative Pain management: pain level controlled Vital Signs Assessment: post-procedure vital signs reviewed and stable Respiratory status: spontaneous breathing Cardiovascular status: stable Anesthetic complications: no    Last Vitals:  Filed Vitals:   09/22/15 0002 09/22/15 0400  BP: 122/57 102/57  Pulse: 85 82  Temp: 36.5 C 36.5 C  Resp: 18 16    Last Pain:  Filed Vitals:   09/22/15 0645  PainSc: 9                  Alexandra Lipps

## 2015-09-22 NOTE — Progress Notes (Signed)
Patient ID: [name], female   DOB: [birth year], 55 y.o.   MRN: [id number] Subjective: Patient reports severe back pain, no leg pain or NTW  Objective: Vital signs in last 24 hours: Temp:  [97 F (36.1 C)-98.2 F (36.8 C)] 98.1 F (36.7 C) (07/13 1129) Pulse Rate:  [73-92] 84 (07/13 1129) Resp:  [13-32] 18 (07/13 1129) BP: (100-139)/(39-97) 108/39 mmHg (07/13 1129) SpO2:  [95 %-100 %] 95 % (07/13 1129)  Intake/Output from previous day: 07/12 0701 - 07/13 0700 In: 1000 [I.V.:1000] Out: 850 [Urine:750; Blood:100] Intake/Output this shift:    Neurologic: Grossly normal  Lab Results: Lab Results  Component Value Date   WBC 6.6 09/16/2015   HGB 13.2 09/16/2015   HCT 40.7 09/16/2015   MCV 91.9 09/16/2015   PLT 254 09/16/2015   Lab Results  Component Value Date   INR 1.02 09/16/2015   BMET Lab Results  Component Value Date   NA 140 09/16/2015   K 4.2 09/16/2015   CL 108 09/16/2015   CO2 24 09/16/2015   GLUCOSE 90 09/16/2015   BUN 14 09/16/2015   CREATININE 0.64 09/16/2015   CALCIUM 8.8* 09/16/2015    Studies/Results: Dg Lumbar Spine 2-3 Views  09/21/2015  CLINICAL DATA:  Status post surgical posterior fusion of L5-S1. EXAM: DG C-ARM 61-120 MIN; LUMBAR SPINE - 2-3 VIEW FLUOROSCOPY TIME:  57 seconds. COMPARISON:  None. FINDINGS: Two intraoperative fluoroscopic images of the lumbar spine were obtained. These demonstrate the patient be status post surgical posterior fusion of L5-S1 with bilateral intrapedicular screw placement and interbody fusion. Good alignment of vertebral bodies is noted. IMPRESSION: Status post surgical posterior fusion of L5-S1. Electronically Signed   By: Lupita Raider, M.D.   On: 09/21/2015 16:42   Dg C-arm 61-120 Min  09/21/2015  CLINICAL DATA:  Status post surgical posterior fusion of L5-S1. EXAM: DG C-ARM 61-120 MIN; LUMBAR SPINE - 2-3 VIEW FLUOROSCOPY TIME:  57 seconds. COMPARISON:  None. FINDINGS: Two intraoperative fluoroscopic images of  the lumbar spine were obtained. These demonstrate the patient be status post surgical posterior fusion of L5-S1 with bilateral intrapedicular screw placement and interbody fusion. Good alignment of vertebral bodies is noted. IMPRESSION: Status post surgical posterior fusion of L5-S1. Electronically Signed   By: Lupita Raider, M.D.   On: 09/21/2015 16:42    Assessment/Plan: S/p PLIF, now with severe pain, likely secondary in part to large of amounts of narcotic analgesics prescribed prior to surgery. Will change meds around some and try to achieve some element of pain control   LOS: 1 day    Rutledge Selsor S 09/22/2015, 3:04 PM

## 2015-09-22 NOTE — Progress Notes (Addendum)
Occupational Therapy Evaluation Patient Details Name: [name] MRN: [id number] DOB: 05-05-1960 Today's Date: 09/22/2015    History of Present Illness 55 y.o. female s/p L5-S1 PLIF. PMH significant for arthritis, DDD, anxiety, depression, GERD, RLE weakness and tingling, dizziness, and vitamin D deficiency.   Clinical Impression   PTA, pt was independent with ADLs and mobility. Pt currently requires mod assist for LB ADLs and min guard assist for basic transfers. Began education on back precautions, brace wear protocol, positioning for sleep, and compensatory strategies for LB ADLs. Pt plans to d/c home with 24/7 assistance from her husband. Pt will benefit from continued acute OT to increase independence and safety with ADLs and mobility to allow for safe discharge home. Recommend RW for home use, no OT follow up.    Follow Up Recommendations  No OT follow up;Supervision/Assistance - 24 hour    Equipment Recommendations  Other (comment) (RW-2 wheeled; adaptive equipment - pt to purchase)    Recommendations for Other Services       Precautions / Restrictions Precautions Precautions: Back Precaution Booklet Issued: Yes (comment) Precaution Comments: Provided handout and reviewed all back precautions  Required Braces or Orthoses: Spinal Brace Spinal Brace: Lumbar corset;Applied in sitting position Restrictions Weight Bearing Restrictions: No      Mobility Bed Mobility Overal bed mobility: Needs Assistance Bed Mobility: Sidelying to Sit   Sidelying to sit: Min assist       General bed mobility comments: Assist for trunk support to come sitting position. VCs for log roll technique - pt already sidelying on OT arrival. Educated on positioning and placement of pillows to maintain neutral spine alignment  Transfers Overall transfer level: Needs assistance Equipment used: Rolling walker (2 wheeled) Transfers: Sit to/from Stand Sit to Stand: Min guard         General  transfer comment: Min guard assist for safety and balance. Pt with BLE weakness and buckling at times, but no LOB observed. VCs for safe hand placement and proper RW use.    Balance Overall balance assessment: Needs assistance Sitting-balance support: No upper extremity supported;Feet supported Sitting balance-Leahy Scale: Good     Standing balance support: Bilateral upper extremity supported;During functional activity Standing balance-Leahy Scale: Poor Standing balance comment: reliant on UE support or leaning trunk against coutnertop to maintain static standing balance                            ADL Overall ADL's : Needs assistance/impaired     Grooming: Wash/dry hands;Min guard;Standing   Upper Body Bathing: Set up;Sitting   Lower Body Bathing: Moderate assistance;Sit to/from stand;Cueing for compensatory techniques Lower Body Bathing Details (indicate cue type and reason): educated on use of long-handled sponge Upper Body Dressing : Set up;Sitting   Lower Body Dressing: Moderate assistance;Sit to/from stand;Cueing for compensatory techniques Lower Body Dressing Details (indicate cue type and reason): educated on availability of reacher and sock aid Toilet Transfer: Min guard;Cueing for safety;Ambulation;BSC;RW Toilet Transfer Details (indicate cue type and reason): cues for safe hand placement on 3in1 Toileting- Clothing Manipulation and Hygiene: Min guard;Sit to/from stand;Cueing for compensatory techniques Toileting - Clothing Manipulation Details (indicate cue type and reason): cues to avoid twisting to flush toilet and educated on use of wet wipes     Functional mobility during ADLs: Min guard;Rolling walker General ADL Comments: Educated on back precautions, brace wear protocol, positioning for sleep, and compensatory strategies for LB ADLs. Pt's husband present for OT  eval.     Vision Vision Assessment?: No apparent visual deficits   Perception      Praxis      Pertinent Vitals/Pain Pain Assessment: 0-10 Pain Score: 8  Pain Location: incision site Pain Descriptors / Indicators: Operative site guarding;Throbbing Pain Intervention(s): Limited activity within patient's tolerance;Monitored during session;Premedicated before session;Repositioned     Hand Dominance Right   Extremity/Trunk Assessment Upper Extremity Assessment Upper Extremity Assessment: Overall WFL for tasks assessed   Lower Extremity Assessment Lower Extremity Assessment: Generalized weakness (LLE pain > R; decreased distal sensation)   Cervical / Trunk Assessment Cervical / Trunk Assessment: Other exceptions Cervical / Trunk Exceptions: s/p lumbar surgery   Communication Communication Communication: No difficulties   Cognition Arousal/Alertness: Awake/alert Behavior During Therapy: WFL for tasks assessed/performed Overall Cognitive Status: Within Functional Limits for tasks assessed                     General Comments       Exercises       Shoulder Instructions      Home Living Family/patient expects to be discharged to:: Private residence Living Arrangements: Spouse/significant other Available Help at Discharge: Family;Available 24 hours/day (initially) Type of Home: House Home Access: Stairs to enter [employer] of Steps: 4 Entrance Stairs-Rails: Left Home Layout: One level     Bathroom Shower/Tub: Chief Strategy Officer: Handicapped height Bathroom Accessibility: Yes   Home Equipment: Bedside commode;Shower seat;Grab bars - tub/shower;Hand held shower head          Prior Functioning/Environment Level of Independence: Independent             OT Diagnosis: Generalized weakness;Acute pain   OT Problem List: Decreased strength;Decreased activity tolerance;Impaired balance (sitting and/or standing);Decreased safety awareness;Decreased knowledge of precautions;Decreased knowledge of use of DME or  AE;Pain;Impaired sensation   OT Treatment/Interventions: Self-care/ADL training;Therapeutic exercise;DME and/or AE instruction;Patient/family education;Balance training    OT Goals(Current goals can be found in the care plan section) Acute Rehab OT Goals Patient Stated Goal: to go home OT Goal Formulation: With patient Time For Goal Achievement: 10/06/15 Potential to Achieve Goals: Good ADL Goals Pt Will Perform Lower Body Dressing: with modified independence;with adaptive equipment;sit to/from stand Pt Will Transfer to Toilet: with modified independence;ambulating;bedside commode (over toilet) Pt Will Perform Toileting - Clothing Manipulation and hygiene: with modified independence;sit to/from stand Pt Will Perform Tub/Shower Transfer: Tub transfer;with supervision;ambulating;shower seat;rolling walker;grab bars Additional ADL Goal #1: Pt will independently don/doff back brace to increase independence with ADLs and mobility. Additional ADL Goal #2: Pt will demonstrate adherence to 3/3 back precautions with min verbal cues.  OT Frequency: Min 2X/week   Barriers to D/C:            Co-evaluation              End of Session Equipment Utilized During Treatment: Gait belt;Back brace;Rolling walker Nurse Communication: Mobility status;Other (comment) (Pt will need RW)  Activity Tolerance: Patient tolerated treatment well Patient left: Other (comment) (with PT)   Time: 2876-8115 OT Time Calculation (min): 18 min Charges:  OT General Charges $OT Visit: 1 Procedure OT Evaluation $OT Eval Moderate Complexity: 1 Procedure G-Codes:    Nils Pyle, OTR/L Pager: [phone] 09/22/2015, 10:17 AM

## 2015-09-22 NOTE — Evaluation (Signed)
Physical Therapy Evaluation Patient Details Name: [name] MRN: [id number] DOB: [birth year] Today's Date: 09/22/2015   History of Present Illness  patient is 55 yo female s/p L5/S1 MAXIMUM ACCESS (MAS) POSTERIOR LUMBAR INTERBODY FUSION (PLIF)  Clinical Impression  Patient demonstrates deficits in functional mobility as indicated below. Will benefit from continued skilled PT to address deficits and maximize function. Will see as indicated and progress as tolerated. Educated on spinal precautions, mobility expectations, safety and positioning.    Follow Up Recommendations No PT follow up;Supervision/Assistance - 24 hour    Equipment Recommendations  Rolling walker with 5" wheels    Recommendations for Other Services       Precautions / Restrictions Precautions Precautions: Back Precaution Booklet Issued: Yes (comment) Precaution Comments: verbally reviewed with patient Required Braces or Orthoses: Spinal Brace Spinal Brace: Lumbar corset      Mobility  Bed Mobility               General bed mobility comments: received at EOB  Transfers Overall transfer level: Needs assistance Equipment used: Rolling walker (2 wheeled) Transfers: Sit to/from Stand Sit to Stand: Min guard         General transfer comment: VCs for hand placement and positioning  Ambulation/Gait Ambulation/Gait assistance: Supervision Ambulation Distance (Feet): 280 Feet Assistive device: Rolling walker (2 wheeled) Gait Pattern/deviations: Step-through pattern;Decreased stride length;Drifts right/left Gait velocity: decreased Gait velocity interpretation: Below normal speed for age/gender General Gait Details: VCs for increased cadence and normalized gait  Stairs Stairs: Yes Stairs assistance: Mod assist Stair Management: One rail Left;Step to pattern;Forwards Number of Stairs: 3 General stair comments: hand held assist  Wheelchair Mobility    Modified Rankin (Stroke Patients Only)        Balance Overall balance assessment: History of Falls                                           Pertinent Vitals/Pain Pain Assessment: 0-10 Pain Score: 8  Pain Location: operative site Pain Descriptors / Indicators: Operative site guarding;Throbbing Pain Intervention(s): Monitored during session    Home Living Family/patient expects to be discharged to:: Private residence Living Arrangements: Spouse/significant other Available Help at Discharge: Family Type of Home: House Home Access: Stairs to enter Entrance Stairs-Rails: Left Entrance Stairs-Number of Steps: 4 Home Layout: One level Home Equipment: Bedside commode;Shower seat;Grab bars - tub/shower;Hand held shower head      Prior Function Level of Independence: Independent               Hand Dominance   Dominant Hand: Right    Extremity/Trunk Assessment   Upper Extremity Assessment: Defer to OT evaluation           Lower Extremity Assessment: Generalized weakness (LLE pain > R; decreased distal sensation)         Communication   Communication: No difficulties  Cognition Arousal/Alertness: Awake/alert Behavior During Therapy: WFL for tasks assessed/performed Overall Cognitive Status: Within Functional Limits for tasks assessed                      General Comments      Exercises        Assessment/Plan    PT Assessment Patient needs continued PT services  PT Diagnosis Difficulty walking;Abnormality of gait;Generalized weakness;Acute pain   PT Problem List Decreased strength;Decreased activity tolerance;Decreased balance;Decreased mobility;Decreased knowledge of use of  DME;Decreased safety awareness;Decreased knowledge of precautions;Pain  PT Treatment Interventions DME instruction;Gait training;Stair training;Functional mobility training;Therapeutic activities;Therapeutic exercise;Balance training;Patient/family education   PT Goals (Current goals can be found  in the Care Plan section) Acute Rehab PT Goals Patient Stated Goal: to go home PT Goal Formulation: With patient Time For Goal Achievement: 10/06/15 Potential to Achieve Goals: Good    Frequency Min 5X/week   Barriers to discharge        Co-evaluation               End of Session Equipment Utilized During Treatment: Back brace Activity Tolerance: Patient tolerated treatment well;Patient limited by pain Patient left: in chair;with call bell/phone within reach;with family/visitor present Nurse Communication: Mobility status         Time: 0355-9741 PT Time Calculation (min) (ACUTE ONLY): 16 min   Charges:   PT Evaluation $PT Eval Moderate Complexity: 1 Procedure     PT G CodesFabio Asa September 24, 2015, 9:09 AM Charlotte Crumb, PT DPT  [phone]

## 2015-09-23 ENCOUNTER — Encounter (HOSPITAL_COMMUNITY): Payer: Self-pay | Admitting: Neurological Surgery

## 2015-09-23 MED ORDER — ESTRADIOL 1 MG PO TABS
0.5000 mg | ORAL_TABLET | Freq: Once | ORAL | Status: AC
  Administered 2015-09-23: 0.5 mg via ORAL
  Filled 2015-09-23: qty 0.5

## 2015-09-23 NOTE — Progress Notes (Signed)
Patient ID: [name], female   DOB: 10/21/1960, 55 y.o.   MRN: [id number] Subjective: Patient reports she's feeling better, no leg pain or NTW   Objective: Vital signs in last 24 hours: Temp:  [97.7 F (36.5 C)-98.3 F (36.8 C)] 98.1 F (36.7 C) (07/14 0845) Pulse Rate:  [62-92] 62 (07/14 0845) Resp:  [18-20] 20 (07/14 0845) BP: (98-114)/(39-62) 98/45 mmHg (07/14 0845) SpO2:  [95 %-100 %] 98 % (07/14 0845)  Intake/Output from previous day:   Intake/Output this shift: Total I/O In: 360 [P.O.:360] Out: -   Neurologic: Grossly normal  Lab Results: Lab Results  Component Value Date   WBC 6.6 09/16/2015   HGB 13.2 09/16/2015   HCT 40.7 09/16/2015   MCV 91.9 09/16/2015   PLT 254 09/16/2015   Lab Results  Component Value Date   INR 1.02 09/16/2015   BMET Lab Results  Component Value Date   NA 140 09/16/2015   K 4.2 09/16/2015   CL 108 09/16/2015   CO2 24 09/16/2015   GLUCOSE 90 09/16/2015   BUN 14 09/16/2015   CREATININE 0.64 09/16/2015   CALCIUM 8.8* 09/16/2015    Studies/Results: Dg Lumbar Spine 2-3 Views  09/21/2015  CLINICAL DATA:  Status post surgical posterior fusion of L5-S1. EXAM: DG C-ARM 61-120 MIN; LUMBAR SPINE - 2-3 VIEW FLUOROSCOPY TIME:  57 seconds. COMPARISON:  None. FINDINGS: Two intraoperative fluoroscopic images of the lumbar spine were obtained. These demonstrate the patient be status post surgical posterior fusion of L5-S1 with bilateral intrapedicular screw placement and interbody fusion. Good alignment of vertebral bodies is noted. IMPRESSION: Status post surgical posterior fusion of L5-S1. Electronically Signed   By: Lupita Raider, M.D.   On: 09/21/2015 16:42   Dg C-arm 61-120 Min  09/21/2015  CLINICAL DATA:  Status post surgical posterior fusion of L5-S1. EXAM: DG C-ARM 61-120 MIN; LUMBAR SPINE - 2-3 VIEW FLUOROSCOPY TIME:  57 seconds. COMPARISON:  None. FINDINGS: Two intraoperative fluoroscopic images of the lumbar spine were obtained.  These demonstrate the patient be status post surgical posterior fusion of L5-S1 with bilateral intrapedicular screw placement and interbody fusion. Good alignment of vertebral bodies is noted. IMPRESSION: Status post surgical posterior fusion of L5-S1. Electronically Signed   By: Lupita Raider, M.D.   On: 09/21/2015 16:42    Assessment/Plan: Better, home tomorrow   LOS: 2 days    Ritika Hellickson S 09/23/2015, 11:16 AM

## 2015-09-23 NOTE — Discharge Instructions (Signed)
Wound Care Keep incision covered and dry for one week.  If you shower prior to then, cover incision with plastic wrap.  You may remove outer bandage after one week and shower.  Do not put any creams, lotions, or ointments on incision. Leave steri-strips on neck.  They will fall off by themselves. Activity Walk each and every day, increasing distance each day. No lifting greater than 5 lbs.  Avoid bending, arching, or twisting. No driving for 2 weeks; may ride as a passenger locally. If provided with back brace, wear when out of bed.  It is not necessary to wear in bed. Diet Resume your normal diet.  Return to Work Will be discussed at you follow up appointment. Call Your Doctor If Any of These Occur Redness, drainage, or swelling at the wound.  Temperature greater than 101 degrees. Severe pain not relieved by pain medication. Incision starts to come apart. Follow Up Appt Call today for appointment in 2-3 weeks ([phone]) or for problems.  If you have any hardware placed in your spine, you will need an x-ray before your appointment.  Spinal Fusion Spinal fusion is a procedure to make 2 or more of the bones in your spinal column (vertebrae) grow together (fuse). This procedure stops movement between the vertebrae and can relieve pain and prevent deformity.  Spinal fusion is used to treat the following conditions:  Fractures of the spine.  Herniated disk (the spongy material [cartilage] between the vertebrae).  Abnormal curvatures of the spine, such as scoliosis or kyphosis.  A weak or an unstable spine, caused by infections or tumor. RISKS AND COMPLICATIONS Complications associated with spinal fusion are rare, but they can occur. Possible complications include:  Bleeding.  Infection near the incision.  Nerve damage. Signs of nerve damage are back pain, pain in one or both legs, weakness, or numbness.  Spinal fluid leakage.  Blood clot in your leg, which can move to your  lungs.  Difficulty controlling urination or bowel movements. BEFORE THE PROCEDURE  A medical evaluation will be done. This will include a physical exam, blood tests, and imaging exams.  You will talk with an anesthesiologist. This is the person who will be in charge of the anesthesia during the procedure. Spinal fusion usually requires that you are asleep during the procedure (general anesthesia).  You will need to stop taking certain medicines, particularly those associated with an increased risk of bleeding. Ask your caregiver about changing or stopping your regular medicines.  If you smoke, you will need to stop at least 2 weeks before the procedure. Smoking can slow down the healing process, especially fusion of the vertebrae, and increase the risk of complications.  Do not eat or drink anything for at least 8 hours before the procedure. PROCEDURE  A cut (incision) is made over the vertebrae that will be fused. The back muscles are separated from the vertebrae. If you are having this procedure to treat a herniated disk, the disc material pressing on the nerve root is removed (decompression). The area where the disk is removed is then filled with extra bone. Bone from another part of your body (autogenous bone) or bone from a bone donor (allograft bone) may be used. The extra bone promotes fusion between the vertebrae. Sometimes, specific medicines are added to the fusion area to promote bone healing. In most cases, screws and rods or metal plates will be used to attach the vertebrae to stabilize them while they fuse.  AFTER THE PROCEDURE  You will stay in a recovery area until the anesthesia has worn off. Your blood pressure and pulse will be checked frequently.  You will be given antibiotics to prevent infection.  You may continue to receive fluids through an intravenous (IV) tube while you are still in the hospital.  Pain after surgery is normal. You will be given pain medicine.  You  will be taught how to move correctly and how to stand and walk. While in bed, you will be instructed to turn frequently, using a "log rolling" technique, in which the entire body is moved without twisting the back.   This information is not intended to replace advice given to you by your health care provider. Make sure you discuss any questions you have with your health care provider.   Document Released: 11/25/2002 Document Revised: 05/21/2011 Document Reviewed: 08/11/2014 Elsevier Interactive Patient Education [employer]

## 2015-09-23 NOTE — Progress Notes (Signed)
Occupational Therapy Treatment Patient Details Name: [name] MRN: [id number] DOB: Sep 05, 1960 Today's Date: 09/23/2015    History of present illness 55 y.o. female s/p L5-S1 PLIF. PMH significant for arthritis, DDD, anxiety, depression, GERD, RLE weakness and tingling, dizziness, and vitamin D deficiency.   OT comments  Pt making good progress. Completed education regarding AE and compensatory techniques for AE while following back precautions. Pt verbalized understanding. Pt appropriate for D/C home when medically stable. OT signing off.   Follow Up Recommendations  No OT follow up;Supervision/Assistance - 24 hour    Equipment Recommendations  Other (comment)    Recommendations for Other Services      Precautions / Restrictions Precautions Precautions: Back Required Braces or Orthoses: Spinal Brace Spinal Brace: Lumbar corset;Applied in sitting position Restrictions Weight Bearing Restrictions: No       Mobility Bed Mobility Overal bed mobility: Needs Assistance Bed Mobility: Rolling;Sidelying to Sit Rolling: Modified independent (Device/Increase time) Sidelying to sit: Supervision          Transfers Overall transfer level: Needs assistance Equipment used: 1 person hand held assist Transfers: Sit to/from [employer] Transfers Sit to Stand: Supervision Stand pivot transfers: Supervision            Balance     Sitting balance-Leahy Scale: Good       Standing balance-Leahy Scale: Fair                     ADL                                       Functional mobility during ADLs: Supervision/safety General ADL Comments: completed educaiton on LB ADL with use of compensatory techniques and AE. Pt verbalized understanding. Educated on bed posiitoning and bed mobility and activity level. Also educated on home set up and reducing risk of falls.       Vision                     Perception     Praxis       Cognition   Behavior During Therapy: WFL for tasks assessed/performed Overall Cognitive Status: Within Functional Limits for tasks assessed                       Extremity/Trunk Assessment               Exercises     Shoulder Instructions       General Comments      Pertinent Vitals/ Pain       Pain Assessment: Faces Faces Pain Scale: Hurts little more Pain Location: back Pain Descriptors / Indicators: Discomfort Pain Intervention(s): Limited activity within patient's tolerance  Home Living                                          Prior Functioning/Environment              Frequency Min 2X/week     Progress Toward Goals  OT Goals(current goals can now be found in the care plan section)  Progress towards OT goals: Progressing toward goals  Acute Rehab OT Goals Patient Stated Goal: to go home OT Goal Formulation: With patient Time For Goal Achievement: 10/06/15 Potential to Achieve Goals: Good  ADL Goals Pt Will Perform Lower Body Dressing: with modified independence;with adaptive equipment;sit to/from stand Pt Will Transfer to Toilet: with modified independence;ambulating;bedside commode Pt Will Perform Toileting - Clothing Manipulation and hygiene: with modified independence;sit to/from stand Pt Will Perform Tub/Shower Transfer: Tub transfer;with supervision;ambulating;shower seat;rolling walker;grab bars Additional ADL Goal #1: Pt will independently don/doff back brace to increase independence with ADLs and mobility. Additional ADL Goal #2: Pt will demonstrate adherence to 3/3 back precautions with min verbal cues.  Plan Discharge plan remains appropriate    Co-evaluation                 End of Session Equipment Utilized During Treatment: Back brace   Activity Tolerance Patient tolerated treatment well   Patient Left in chair;with call bell/phone within reach   Nurse Communication Mobility status         Time: 7062-3762 OT Time Calculation (min): 26 min  Charges: OT General Charges $OT Visit: 1 Procedure OT Treatments $Self Care/Home Management : 23-37 mins  Earon Rivest,HILLARY 09/23/2015, 12:40 PM   [facility], OTR/L  [phone] 09/23/2015

## 2015-09-23 NOTE — Progress Notes (Signed)
PT Cancellation Note  Patient Details Name: [name] MRN: [id number] DOB: 1960-11-18   Cancelled Treatment:    Reason Eval/Treat Not Completed: Other (comment) (refused x 2, states she is comfortable in bed and doesn't) want to move.  Husband there for both attempts, will try later as time and pt allow.   Ivar Drape 09/23/2015, 1:32 PM    Samul Dada, PT MS Acute Rehab Dept. Number: [facility] [id number] and [facility] [phone]

## 2015-09-23 NOTE — Care Management Note (Signed)
Case Management Note  Patient Details  Name: [name] MRN: [id number] Date of Birth: 09/25/1960  Subjective/Objective:                    Action/Plan:   Expected Discharge Date:                  Expected Discharge Plan:  Home/Self Care  In-House Referral:     Discharge planning Services  CM Consult  Post Acute Care Choice:  Durable Medical Equipment Choice offered to:     DME Arranged:  Walker rolling DME Agency:  [facility]  HH Arranged:    [facility] Agency:     Status of Service:  Completed, signed off  If discussed at [employer] of Stay Meetings, dates discussed:    Additional Comments:  Kingsley Plan, RN 09/23/2015, 8:29 AM

## 2015-09-24 NOTE — Progress Notes (Signed)
No acute events AVSS Moving legs well Incision looks good Ready for d/c

## 2015-09-24 NOTE — Progress Notes (Signed)
Patient alert and oriented, mae's well, voiding adequate amount of urine, swallowing without difficulty, c/o mild pain. Patient discharged home with family. Script and discharged instructions given to patient. Patient and family stated understanding of d/c instructions given and has an appointment with MD.   

## 2015-09-24 NOTE — Progress Notes (Signed)
Physical Therapy Treatment Patient Details Name: [name] MRN: [id number] DOB: Feb 19, 1961 Today's Date: 10/13/2015    History of Present Illness patient is 55 yo female s/p L5/S1 MAXIMUM ACCESS (MAS) POSTERIOR LUMBAR INTERBODY FUSION (PLIF)    PT Comments    Mobilizing well, good recall of precautions. Educated on car transfer again to reinforce. Performed stair negotiation without difficulty.  Follow Up Recommendations  No PT follow up;Supervision/Assistance - 24 hour     Equipment Recommendations  Rolling walker with 5" wheels    Recommendations for Other Services       Precautions / Restrictions Precautions Precautions: Back Precaution Booklet Issued: Yes (comment) Precaution Comments: verbally reviewed with patient Required Braces or Orthoses: Spinal Brace Spinal Brace: Lumbar corset    Mobility  Bed Mobility Overal bed mobility: Modified Independent Bed Mobility: Rolling;Sidelying to Sit Rolling: Modified independent (Device/Increase time) Sidelying to sit: Modified independent (Device/Increase time)          Transfers Overall transfer level: Needs assistance Equipment used: Rolling walker (2 wheeled) Transfers: Sit to/from Stand Sit to Stand: Supervision         General transfer comment: continue VCs for hand placement  Ambulation/Gait Ambulation/Gait assistance: Modified independent (Device/Increase time) Ambulation Distance (Feet): 300 Feet Assistive device: Rolling walker (2 wheeled) Gait Pattern/deviations: Step-through pattern;Decreased stride length;Drifts right/left         Stairs   Stairs assistance: Min assist Stair Management: One rail Right Number of Stairs: 4 General stair comments: hand held assist  Wheelchair Mobility    Modified Rankin (Stroke Patients Only)       Balance                                    Cognition Arousal/Alertness: Awake/alert Behavior During Therapy: WFL for tasks  assessed/performed Overall Cognitive Status: Within Functional Limits for tasks assessed                      Exercises      General Comments General comments (skin integrity, edema, etc.): educated on car transfer       Pertinent Vitals/Pain Pain Assessment: 0-10 Pain Score: 4  Pain Location: back and buttocks Pain Descriptors / Indicators: Sore Pain Intervention(s): Monitored during session    Home Living                      Prior Function            PT Goals (current goals can now be found in the care plan section) Acute Rehab PT Goals Patient Stated Goal: to go home PT Goal Formulation: With patient Time For Goal Achievement: 10/06/15 Potential to Achieve Goals: Good Progress towards PT goals: Progressing toward goals    Frequency  Min 5X/week    PT Plan Current plan remains appropriate    Co-evaluation             End of Session Equipment Utilized During Treatment: Back brace Activity Tolerance: Patient tolerated treatment well;Patient limited by pain Patient left: in chair;with call bell/phone within reach;with family/visitor present     Time: 0820-0838 PT Time Calculation (min) (ACUTE ONLY): 18 min  Charges:  $Gait Training: 8-22 mins                    G CodesFabio Mullen 13-Oct-2015, 9:18 AM [name], PT DPT  [phone]   

## 2015-09-29 NOTE — Discharge Summary (Signed)
Physician Discharge Summary  Patient ID: [name] MRN: [id number] DOB/AGE: 11/10/1960 55 y.o.  Admit date: 09/21/2015 Discharge date: 09/29/2015  Admission Diagnoses: failed back    Discharge Diagnoses: same   Discharged Condition: good  Hospital Course: The patient was admitted on 09/21/2015 and taken to the operating room where the patient underwent PLIF. The patient tolerated the procedure well and was taken to the recovery room and then to the floor in stable condition. The hospital course was routine. There were no complications. The wound remained clean dry and intact. Pt had appropriate back soreness. No complaints of leg pain or new N/T/W. The patient remained afebrile with stable vital signs, and tolerated a regular diet. The patient continued to increase activities, and pain was well controlled with oral pain medications.   Consults: None  Significant Diagnostic Studies:  Results for orders placed or performed during the hospital encounter of 09/16/15  Surgical pcr screen  Result Value Ref Range   MRSA, PCR NEGATIVE NEGATIVE   Staphylococcus aureus POSITIVE (A) NEGATIVE  Basic metabolic panel  Result Value Ref Range   Sodium 140 135 - 145 mmol/L   Potassium 4.2 3.5 - 5.1 mmol/L   Chloride 108 101 - 111 mmol/L   CO2 24 22 - 32 mmol/L   Glucose, Bld 90 65 - 99 mg/dL   BUN 14 6 - 20 mg/dL   Creatinine, Ser 9.21 0.44 - 1.00 mg/dL   Calcium 8.8 (L) 8.9 - 10.3 mg/dL   GFR calc non Af Amer >60 >60 mL/min   GFR calc Af Amer >60 >60 mL/min   Anion gap 8 5 - 15  CBC WITH DIFFERENTIAL  Result Value Ref Range   WBC 6.6 4.0 - 10.5 K/uL   RBC 4.43 3.87 - 5.11 MIL/uL   Hemoglobin 13.2 12.0 - 15.0 g/dL   HCT 19.4 17.4 - 08.1 %   MCV 91.9 78.0 - 100.0 fL   MCH 29.8 26.0 - 34.0 pg   MCHC 32.4 30.0 - 36.0 g/dL   RDW 44.8 18.5 - 63.1 %   Platelets 254 150 - 400 K/uL   Neutrophils Relative % 66 %   Neutro Abs 4.3 1.7 - 7.7 K/uL   Lymphocytes Relative 27 %   Lymphs Abs 1.8  0.7 - 4.0 K/uL   Monocytes Relative 6 %   Monocytes Absolute 0.4 0.1 - 1.0 K/uL   Eosinophils Relative 1 %   Eosinophils Absolute 0.1 0.0 - 0.7 K/uL   Basophils Relative 0 %   Basophils Absolute 0.0 0.0 - 0.1 K/uL  Protime-INR  Result Value Ref Range   Prothrombin Time 13.6 11.6 - 15.2 seconds   INR 1.02 0.00 - 1.49  Type and screen All Cardiac and thoracic surgeries, spinal fusions, myomectomies, craniotomies, colon & liver resections, total joint revisions, same day c-section with placenta previa or accreta.  Result Value Ref Range   ABO/RH(D) O POS    Antibody Screen NEG    Sample Expiration 09/30/2015    Extend sample reason NO TRANSFUSIONS OR PREGNANCY IN THE PAST 3 MONTHS   ABO/Rh  Result Value Ref Range   ABO/RH(D) O POS     Chest 2 View  09/16/2015  CLINICAL DATA:  Preop spinal surgery EXAM: CHEST  2 VIEW COMPARISON:  03/30/2015 FINDINGS: Cardiomediastinal silhouette is unremarkable. No acute infiltrate or pleural effusion. No pulmonary edema. Visualized bony thorax is unremarkable. IMPRESSION: No active cardiopulmonary disease. Electronically Signed   By: Natasha Mead M.D.   On:  09/16/2015 14:00   Dg Lumbar Spine 2-3 Views  09/21/2015  CLINICAL DATA:  Status post surgical posterior fusion of L5-S1. EXAM: DG C-ARM 61-120 MIN; LUMBAR SPINE - 2-3 VIEW FLUOROSCOPY TIME:  57 seconds. COMPARISON:  None. FINDINGS: Two intraoperative fluoroscopic images of the lumbar spine were obtained. These demonstrate the patient be status post surgical posterior fusion of L5-S1 with bilateral intrapedicular screw placement and interbody fusion. Good alignment of vertebral bodies is noted. IMPRESSION: Status post surgical posterior fusion of L5-S1. Electronically Signed   By: Lupita Raider, M.D.   On: 09/21/2015 16:42   Dg C-arm 61-120 Min  09/21/2015  CLINICAL DATA:  Status post surgical posterior fusion of L5-S1. EXAM: DG C-ARM 61-120 MIN; LUMBAR SPINE - 2-3 VIEW FLUOROSCOPY TIME:  57 seconds.  COMPARISON:  None. FINDINGS: Two intraoperative fluoroscopic images of the lumbar spine were obtained. These demonstrate the patient be status post surgical posterior fusion of L5-S1 with bilateral intrapedicular screw placement and interbody fusion. Good alignment of vertebral bodies is noted. IMPRESSION: Status post surgical posterior fusion of L5-S1. Electronically Signed   By: Lupita Raider, M.D.   On: 09/21/2015 16:42    Antibiotics:  Anti-infectives    Start     Dose/Rate Route Frequency Ordered Stop   09/21/15 2000  ceFAZolin (ANCEF) IVPB 1 g/50 mL premix     1 g 100 mL/hr over 30 Minutes Intravenous Every 8 hours 09/21/15 1826 09/22/15 0501   09/21/15 1448  vancomycin (VANCOCIN) powder  Status:  Discontinued       As needed 09/21/15 1448 09/21/15 1646   09/21/15 1430  bacitracin 50,000 Units in sodium chloride irrigation 0.9 % 500 mL irrigation  Status:  Discontinued       As needed 09/21/15 1430 09/21/15 1646   09/21/15 1330  vancomycin (VANCOCIN) 1000 MG powder    Comments:  Futch, John   : cabinet override      09/21/15 1330 09/22/15 0144   09/21/15 1230  ceFAZolin (ANCEF) IVPB 2g/100 mL premix     2 g 200 mL/hr over 30 Minutes Intravenous To [facility]Stay [facility] 09/21/15 0735 09/21/15 1415      Discharge Exam: Blood pressure 117/53, pulse 74, temperature 97.9 F (36.6 C), temperature source Oral, resp. rate 20, height 5\' 5"  (1.651 m), weight 82.918 kg (182 lb 12.8 oz), SpO2 99 %. Neurologic: Grossly normal Dressing dry  Discharge Medications:     Medication List    STOP taking these medications        alprazolam 2 MG tablet  Commonly known as:  XANAX     morphine 15 MG tablet  Commonly known as:  MSIR     zolpidem 10 MG tablet  Commonly known as:  AMBIEN      TAKE these medications        estradiol 0.5 MG tablet  Commonly known as:  ESTRACE  Take 0.25-0.5 mg by mouth See admin instructions. 1/2 tab in the morning and 1 tab at night     HALOG 0.1 % Crea   Generic drug:  Halcinonide  Apply 1 application topically 3 (three) times daily as needed (for eczema).     Vitamin D (Ergocalciferol) 50000 units Caps capsule  Commonly known as:  DRISDOL  Take 50,000 Units by mouth 2 (two) times a week.        Disposition: home   Final Dx: PLIF      Discharge Instructions    Call MD for:  difficulty  breathing, headache or visual disturbances    Complete by:  As directed      Call MD for:  persistant nausea and vomiting    Complete by:  As directed      Call MD for:  redness, tenderness, or signs of infection (pain, swelling, redness, odor or green/yellow discharge around incision site)    Complete by:  As directed      Call MD for:  severe uncontrolled pain    Complete by:  As directed      Call MD for:  temperature >100.4    Complete by:  As directed      Diet - low sodium heart healthy    Complete by:  As directed      Discharge instructions    Complete by:  As directed   No driving, no bending or twisting, mat shower     Increase activity slowly    Complete by:  As directed      Remove dressing in 48 hours    Complete by:  As directed            Follow-up Information    Follow up with Kewana Sanon S, MD In 3 weeks.   Specialty:  Neurosurgery   Contact information:   [address] [place] [address] [phone]        Signed: Tia Alert 09/29/2015, 9:50 AM

## 2015-09-30 ENCOUNTER — Encounter (HOSPITAL_COMMUNITY): Payer: Self-pay | Admitting: Emergency Medicine

## 2015-09-30 ENCOUNTER — Emergency Department (HOSPITAL_COMMUNITY)
Admission: EM | Admit: 2015-09-30 | Discharge: 2015-10-01 | Disposition: A | Discharge status: Home / Self Care | Payer: BLUE CROSS/BLUE SHIELD | Attending: Emergency Medicine | Admitting: Emergency Medicine

## 2015-09-30 ENCOUNTER — Emergency Department (HOSPITAL_COMMUNITY): Payer: BLUE CROSS/BLUE SHIELD

## 2015-09-30 DIAGNOSIS — Z79899 Other long term (current) drug therapy: Secondary | ICD-10-CM | POA: Insufficient documentation

## 2015-09-30 DIAGNOSIS — T7840XA Allergy, unspecified, initial encounter: Secondary | ICD-10-CM | POA: Diagnosis not present

## 2015-09-30 DIAGNOSIS — F329 Major depressive disorder, single episode, unspecified: Secondary | ICD-10-CM | POA: Diagnosis not present

## 2015-09-30 DIAGNOSIS — R0602 Shortness of breath: Secondary | ICD-10-CM | POA: Diagnosis not present

## 2015-09-30 DIAGNOSIS — T8069XA Other serum reaction due to other serum, initial encounter: Secondary | ICD-10-CM | POA: Diagnosis not present

## 2015-09-30 DIAGNOSIS — L509 Urticaria, unspecified: Secondary | ICD-10-CM | POA: Diagnosis not present

## 2015-09-30 DIAGNOSIS — R079 Chest pain, unspecified: Secondary | ICD-10-CM | POA: Diagnosis not present

## 2015-09-30 DIAGNOSIS — R509 Fever, unspecified: Secondary | ICD-10-CM | POA: Insufficient documentation

## 2015-09-30 DIAGNOSIS — R0789 Other chest pain: Secondary | ICD-10-CM | POA: Diagnosis not present

## 2015-09-30 DIAGNOSIS — M1990 Unspecified osteoarthritis, unspecified site: Secondary | ICD-10-CM | POA: Insufficient documentation

## 2015-09-30 DIAGNOSIS — R21 Rash and other nonspecific skin eruption: Secondary | ICD-10-CM

## 2015-09-30 LAB — COMPREHENSIVE METABOLIC PANEL
ALT: 18 U/L (ref 14–54)
AST: 20 U/L (ref 15–41)
Albumin: 3.7 g/dL (ref 3.5–5.0)
Alkaline Phosphatase: 87 U/L (ref 38–126)
Anion gap: 9 (ref 5–15)
BUN: 14 mg/dL (ref 6–20)
CHLORIDE: 104 mmol/L (ref 101–111)
CO2: 25 mmol/L (ref 22–32)
CREATININE: 0.64 mg/dL (ref 0.44–1.00)
Calcium: 8.6 mg/dL — ABNORMAL LOW (ref 8.9–10.3)
GFR calc Af Amer: >60 mL/min (ref >60)
GLUCOSE: 97 mg/dL (ref 65–99)
POTASSIUM: 4 mmol/L (ref 3.5–5.1)
Sodium: 138 mmol/L (ref 135–145)
Total Bilirubin: 0.7 mg/dL (ref 0.3–1.2)
Total Protein: 7.3 g/dL (ref 6.5–8.1)

## 2015-09-30 LAB — I-STAT TROPONIN, ED
TROPONIN I, POC: 0 ng/mL (ref 0.00–0.08)
Troponin i, poc: 0 ng/mL (ref 0.00–0.08)

## 2015-09-30 LAB — CBC WITH DIFFERENTIAL/PLATELET
Basophils Absolute: 0 10*3/uL (ref 0.0–0.1)
Basophils Relative: 0 %
EOS ABS: 0.1 10*3/uL (ref 0.0–0.7)
Eosinophils Relative: 1 %
HEMATOCRIT: 37.6 % (ref 36.0–46.0)
HEMOGLOBIN: 12.8 g/dL (ref 12.0–15.0)
LYMPHS ABS: 1.6 10*3/uL (ref 0.7–4.0)
LYMPHS PCT: 18 %
MCH: 30.3 pg (ref 26.0–34.0)
MCHC: 34 g/dL (ref 30.0–36.0)
MCV: 89.1 fL (ref 78.0–100.0)
MONOS PCT: 7 %
Monocytes Absolute: 0.7 10*3/uL (ref 0.1–1.0)
NEUTROS ABS: 6.7 10*3/uL (ref 1.7–7.7)
NEUTROS PCT: 74 %
Platelets: 320 10*3/uL (ref 150–400)
RBC: 4.22 MIL/uL (ref 3.87–5.11)
RDW: 12.4 % (ref 11.5–15.5)
WBC: 9.1 10*3/uL (ref 4.0–10.5)

## 2015-09-30 MED ORDER — RANITIDINE HCL 150 MG/10ML PO SYRP
150.0000 mg | ORAL_SOLUTION | Freq: Once | ORAL | Status: AC
  Administered 2015-09-30: 150 mg via ORAL
  Filled 2015-09-30: qty 10

## 2015-09-30 MED ORDER — DIPHENHYDRAMINE HCL 50 MG/ML IJ SOLN
50.0000 mg | Freq: Once | INTRAMUSCULAR | Status: AC
  Administered 2015-09-30: 50 mg via INTRAVENOUS
  Filled 2015-09-30: qty 1

## 2015-09-30 MED ORDER — OXYCODONE HCL 5 MG PO TABS
20.0000 mg | ORAL_TABLET | Freq: Once | ORAL | Status: AC
  Administered 2015-09-30: 20 mg via ORAL
  Filled 2015-09-30: qty 4

## 2015-09-30 MED ORDER — IOPAMIDOL (ISOVUE-370) INJECTION 76%
100.0000 mL | Freq: Once | INTRAVENOUS | Status: AC | PRN
Start: 2015-09-30 — End: 2015-09-30
  Administered 2015-09-30: 100 mL via INTRAVENOUS

## 2015-09-30 MED ORDER — DIAZEPAM 5 MG PO TABS
5.0000 mg | ORAL_TABLET | Freq: Once | ORAL | Status: AC
  Administered 2015-09-30: 5 mg via ORAL
  Filled 2015-09-30: qty 1

## 2015-09-30 MED ORDER — METHYLPREDNISOLONE SODIUM SUCC 125 MG IJ SOLR
125.0000 mg | Freq: Once | INTRAMUSCULAR | Status: AC
  Administered 2015-09-30: 125 mg via INTRAVENOUS
  Filled 2015-09-30: qty 2

## 2015-09-30 MED ORDER — SODIUM CHLORIDE 0.9 % IV BOLUS (SEPSIS)
1000.0000 mL | Freq: Once | INTRAVENOUS | Status: AC
  Administered 2015-09-30: 1000 mL via INTRAVENOUS

## 2015-09-30 NOTE — ED Notes (Signed)
Patient sent by PCP to rule out PE. C/o of SOB, CP, dizziness and hives to left axilla starting earlier today. Patient had spinal fusion on July 12.

## 2015-09-30 NOTE — ED Provider Notes (Signed)
CSN: [id number]     Arrival date & time 09/30/15  1842 History   First MD Initiated Contact with Patient 09/30/15 1847     Chief Complaint  Patient presents with  . Shortness of Breath     (Consider location/radiation/quality/duration/timing/severity/associated sxs/prior Treatment) HPI   Began having welts under left under arm this AM, then began to look and had them all over, hives, eye lid.  9 days after back surgery.  [facility] physicians and they said need to come in and be checked and they sent her here for PE.   Chest pain 2PM, felt like heart burn, has had it before, had 3 more episodes at the office.  Did have shortness of breath with episodes of CP.  Did seem worse with deep breaths. Had taken one benadryl and laid there.  No fevers No cough, no throat swelling No n/v  Had started taking metamucil for last 4 days for constipation, then yesterday had soft stool  No other new medicines Oxy and valium No hx of food allergies No new foods this AM No new soap or other exposures this AM  No hx of DVT Is taking estradiol      Past Medical History  Diagnosis Date  . Ulcer   . Depression with anxiety     r/t death of mother  . Shortness of breath     JUST WHEN I MOVE - RELATED TO MY BACK PAIN  . Insomnia     takes Xanax nightly as needed as well as Ambien  . Depression     takes Wellbutrin daily  . Constipation     takes Colace daily as needed  . Muscle spasm     takes Robaxin daily as needed  . History of bronchitis yrs ago  . Dizziness   . Weakness     numbness and tingling in right leg  . Back pain     herniated disc  . Vitamin D deficiency     takes Vit D weekly  . GERD (gastroesophageal reflux disease)     was taking Prilosec but has been off 2-3 wks  . Urinary frequency   . Nocturia   . Anxiety     takes Xanax as needed  . Headache(784.0)     occasionally  . Arthritis   . DDD (degenerative disc disease)    Past Surgical History  Procedure  Laterality Date  . Ovarian cyst surgery    . Shoulder arthroscopy with rotator cuff repair Right   . Lumbar laminectomy/decompression microdiscectomy N/A 08/26/2013    Procedure: MICRO LUMBER DECOMPRESSION L5-S1 BILATERAL;  Surgeon: Javier Docker, MD;  Location: [facility];  Service: Orthopedics;  Laterality: N/A;  . Exploratory laparotomy    . Colonoscopy    . Lumbar disc surgery  10/14/2013    revision L5-S1  . Back surgery    . Tubal ligation  1980's  . Abdominal hysterectomy    . Lumbar laminectomy/decompression microdiscectomy Right 10/14/2013    Procedure: REVISION RIGHT L5-S1 DISCECTOMY ;  Surgeon: Venita Lick, MD;  Location: [facility];  Service: Orthopedics;  Laterality: Right;  . Colonoscopy with propofol N/A 11/04/2014    Procedure: COLONOSCOPY WITH PROPOFOL;  Surgeon: Charolett Bumpers, MD;  Location: [facility];  Service: Endoscopy;  Laterality: N/A;  . Maximum access (mas)posterior lumbar interbody fusion (plif) 1 level N/A 09/21/2015    Procedure: L5/S1 MAXIMUM ACCESS (MAS) POSTERIOR LUMBAR INTERBODY FUSION (PLIF);  Surgeon: Tia Alert, MD;  Location:  [facility];  Service: Neurosurgery;  Laterality: N/A;   Family History  Problem Relation Age of Onset  . COPD Father   . Transient ischemic attack Mother     multiple   Social History  Substance Use Topics  . Smoking status: Never Smoker   . Smokeless tobacco: Never Used  . Alcohol Use: No   OB History    No data available     Review of Systems  Constitutional: Positive for fever (at PCP office today 100.9).  HENT: Negative for sore throat.   Eyes: Negative for visual disturbance.  Respiratory: Positive for shortness of breath (only with CP ). Negative for cough.   Cardiovascular: Positive for chest pain (burning).  Gastrointestinal: Negative for nausea, vomiting, abdominal pain, diarrhea and constipation.  Genitourinary: Negative for dysuria and difficulty urinating.  Musculoskeletal: Negative for back pain and neck  pain.  Skin: Positive for rash.  Neurological: Negative for syncope and headaches.      Allergies  Nabumetone  Home Medications   Prior to Admission medications   Medication Sig Start Date End Date Taking? Authorizing Provider  diazepam (VALIUM) 5 MG tablet TAKE 1 TABLET BY MOUTH EVERY 6 HOURS AS NEEDED FOR SPASMS 09/24/15  Yes Historical Provider, MD  estradiol (ESTRACE) 0.5 MG tablet Take 0.25-0.5 mg by mouth See admin instructions. 1/2 tab in the morning and 1 tab at night   Yes Historical Provider, MD  psyllium (METAMUCIL) 58.6 % powder Take 1 packet by mouth daily.   Yes Historical Provider, MD  Vitamin D, Ergocalciferol, (DRISDOL) 50000 units CAPS capsule Take 50,000 Units by mouth 2 (two) times a week. 07/07/15  Yes Historical Provider, MD  zolpidem (AMBIEN) 10 MG tablet Take 10 mg by mouth at bedtime. 08/28/15  Yes Historical Provider, MD  Halcinonide (HALOG) 0.1 % CREA Apply 1 application topically 3 (three) times daily as needed (for eczema).    Historical Provider, MD   BP 108/60 mmHg  Pulse 88  Temp(Src) 98.1 F (36.7 C) (Oral)  Resp 21  SpO2 93% Physical Exam  Constitutional: She is oriented to person, place, and time. She appears well-developed and well-nourished. No distress.  HENT:  Head: Normocephalic and atraumatic.  Eyes: Conjunctivae and EOM are normal.  Neck: Normal range of motion.  Cardiovascular: Normal rate, regular rhythm, normal heart sounds and intact distal pulses.  Exam reveals no gallop and no friction rub.   No murmur heard. Pulmonary/Chest: Effort normal and breath sounds normal. No respiratory distress. She has no wheezes. She has no rales.  Abdominal: Soft. She exhibits no distension. There is no tenderness. There is no guarding.  Musculoskeletal: She exhibits no edema or tenderness.  Neurological: She is alert and oriented to person, place, and time.  Skin: Skin is warm and dry. Rash noted. Rash is urticarial (under left arm, left groin,  buttock, right wrist with erythema and swelling, dorsum left hand with erythema/swelling). She is not diaphoretic. There is erythema.  Nursing note and vitals reviewed.   ED Course  Procedures (including critical care time) Labs Review Labs Reviewed  COMPREHENSIVE METABOLIC PANEL - Abnormal; Notable for the following:    Calcium 8.6 (*)    All other components within normal limits  CBC WITH DIFFERENTIAL/PLATELET  I-STAT TROPOININ, ED  I-STAT TROPOININ, ED    Imaging Review Ct Angio Chest Pe W/cm &/or Wo Cm  09/30/2015  CLINICAL DATA:  Shortness of breath, chest pain, dizziness, and left axillary hives today. Lumbar spinal fusion earlier  this month. EXAM: CT ANGIOGRAPHY CHEST WITH CONTRAST TECHNIQUE: Multidetector CT imaging of the chest was performed using the standard protocol during bolus administration of intravenous contrast. Multiplanar CT image reconstructions and MIPs were obtained to evaluate the vascular anatomy. CONTRAST:  100 mL Isovue 370 COMPARISON:  06/28/2014 FINDINGS: Mediastinum/Lymph Nodes: No pulmonary emboli or thoracic aortic dissection identified. No masses or pathologically enlarged lymph nodes identified. Lungs/Pleura: No pleural effusion. Dependent subsegmental atelectasis in both lower lobes. Major airways are patent. Upper abdomen: Small sliding hiatal hernia. Musculoskeletal: Mild thoracic spondylosis. Review of the MIP images confirms the above findings. IMPRESSION: No evidence of pulmonary emboli or other acute abnormality in the chest. Electronically Signed   By: Sebastian Ache M.D.   On: 09/30/2015 21:22   I have personally reviewed and evaluated these images and lab results as part of my medical decision-making.   EKG Interpretation   Date/Time:  Friday September 30 2015 20:29:33 EDT Ventricular Rate:  100 PR Interval:    QRS Duration: 96 QT Interval:  355 QTC Calculation: 458 R Axis:   81 Text Interpretation:  Sinus tachycardia Baseline wander in lead(s) II  V1  Since prior ECG, rate has increased, baseline wander present Confirmed by  [facility] MD, Khamiya Varin ([id number]) on 10/01/2015 4:39:16 AM      MDM   Final diagnoses:  Chest pain, unspecified chest pain type  Serum sickness, initial encounter, suspected based on rash, arthralgias  Rash  Allergic reaction, initial encounter   55 year old female with history of recent back surgery presents with concern for pleuritic rash beginning in the groin and axilla, and episodes of chest burning with mild dyspnea today. Patient was sent due to concern of elevated temperature, chest pain and shortness of breath for evaluation for pulmonary embolus. CT angiogram was done which showed no evidence of PE. Patient does not describe significant dyspnea on history, has no wheezing, no tachypnea, no throat swelling, and reports the breathing difficulties were only in the setting of chest pain, and do not feel that these symptoms represent anaphylaxis. She has no signs of cellulitis or abscess. 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 Patient was given Benadryl, Solu-Medrol, Zantac, and recommended continued Benadryl use at home. Unclear what the offending agent is that caused these symptoms, given several new medications, and clear trigger today.  Doubt sepsis, no source for infection by history and physical, normal temperature in ED. Discussed need for close PCP and NSU followup, continued monitoring for allergic trigger.   Alvira Monday, MD 10/01/15 [phone]

## 2015-09-30 NOTE — ED Notes (Addendum)
Patient presents from [facility] physicians via EMS hives.   Last VS: 120/90, 102, 99%ra, 16resp.

## 2015-09-30 NOTE — ED Notes (Signed)
Pt and companion given sandwich, cheese and beverage.

## 2015-10-01 NOTE — Discharge Instructions (Signed)

## 2015-11-10 DIAGNOSIS — L309 Dermatitis, unspecified: Secondary | ICD-10-CM | POA: Diagnosis not present

## 2015-11-15 DIAGNOSIS — M961 Postlaminectomy syndrome, not elsewhere classified: Secondary | ICD-10-CM | POA: Diagnosis not present

## 2015-11-28 DIAGNOSIS — F410 Panic disorder [episodic paroxysmal anxiety] without agoraphobia: Secondary | ICD-10-CM | POA: Diagnosis not present

## 2015-12-23 DIAGNOSIS — M1711 Unilateral primary osteoarthritis, right knee: Secondary | ICD-10-CM | POA: Diagnosis not present

## 2015-12-24 DIAGNOSIS — Z23 Encounter for immunization: Secondary | ICD-10-CM | POA: Diagnosis not present

## 2016-01-13 DIAGNOSIS — M1711 Unilateral primary osteoarthritis, right knee: Secondary | ICD-10-CM | POA: Diagnosis not present

## 2016-01-16 DIAGNOSIS — M961 Postlaminectomy syndrome, not elsewhere classified: Secondary | ICD-10-CM | POA: Diagnosis not present

## 2016-01-19 DIAGNOSIS — M1711 Unilateral primary osteoarthritis, right knee: Secondary | ICD-10-CM | POA: Diagnosis not present

## 2016-01-27 DIAGNOSIS — M961 Postlaminectomy syndrome, not elsewhere classified: Secondary | ICD-10-CM | POA: Diagnosis not present

## 2016-01-27 DIAGNOSIS — M545 Low back pain: Secondary | ICD-10-CM | POA: Diagnosis not present

## 2016-01-27 DIAGNOSIS — M25561 Pain in right knee: Secondary | ICD-10-CM | POA: Diagnosis not present

## 2016-01-27 DIAGNOSIS — M6281 Muscle weakness (generalized): Secondary | ICD-10-CM | POA: Diagnosis not present

## 2016-02-07 DIAGNOSIS — M25561 Pain in right knee: Secondary | ICD-10-CM | POA: Diagnosis not present

## 2016-02-07 DIAGNOSIS — M961 Postlaminectomy syndrome, not elsewhere classified: Secondary | ICD-10-CM | POA: Diagnosis not present

## 2016-02-07 DIAGNOSIS — M545 Low back pain: Secondary | ICD-10-CM | POA: Diagnosis not present

## 2016-02-07 DIAGNOSIS — M6281 Muscle weakness (generalized): Secondary | ICD-10-CM | POA: Diagnosis not present

## 2016-02-09 DIAGNOSIS — M545 Low back pain: Secondary | ICD-10-CM | POA: Diagnosis not present

## 2016-02-09 DIAGNOSIS — M6281 Muscle weakness (generalized): Secondary | ICD-10-CM | POA: Diagnosis not present

## 2016-02-09 DIAGNOSIS — M961 Postlaminectomy syndrome, not elsewhere classified: Secondary | ICD-10-CM | POA: Diagnosis not present

## 2016-02-09 DIAGNOSIS — M25561 Pain in right knee: Secondary | ICD-10-CM | POA: Diagnosis not present

## 2016-02-14 DIAGNOSIS — J069 Acute upper respiratory infection, unspecified: Secondary | ICD-10-CM | POA: Diagnosis not present

## 2016-02-18 ENCOUNTER — Ambulatory Visit (INDEPENDENT_AMBULATORY_CARE_PROVIDER_SITE_OTHER): Payer: BLUE CROSS/BLUE SHIELD

## 2016-02-18 ENCOUNTER — Ambulatory Visit (HOSPITAL_COMMUNITY)
Admission: EM | Admit: 2016-02-18 | Discharge: 2016-02-18 | Disposition: A | Discharge status: Home / Self Care | Payer: BLUE CROSS/BLUE SHIELD | Attending: Family Medicine | Admitting: Family Medicine

## 2016-02-18 ENCOUNTER — Encounter (HOSPITAL_COMMUNITY): Payer: Self-pay

## 2016-02-18 DIAGNOSIS — R05 Cough: Secondary | ICD-10-CM | POA: Diagnosis not present

## 2016-02-18 DIAGNOSIS — J0100 Acute maxillary sinusitis, unspecified: Secondary | ICD-10-CM | POA: Diagnosis not present

## 2016-02-18 DIAGNOSIS — R079 Chest pain, unspecified: Secondary | ICD-10-CM | POA: Diagnosis not present

## 2016-02-18 DIAGNOSIS — R059 Cough, unspecified: Secondary | ICD-10-CM

## 2016-02-18 MED ORDER — AMOXICILLIN-POT CLAVULANATE 875-125 MG PO TABS: 1.0000 | ORAL_TABLET | Freq: Two times a day (BID) | ORAL | 0 refills | Status: DC

## 2016-02-18 MED ORDER — IPRATROPIUM BROMIDE 0.06 % NA SOLN: 2.0000 | Freq: Four times a day (QID) | NASAL | 1 refills | Status: AC

## 2016-02-18 NOTE — Discharge Instructions (Signed)
Drink plenty of fluids as discussed, use medicine as prescribed, and mucinex or delsym for cough. Return or see your doctor if further problems °

## 2016-02-18 NOTE — ED Provider Notes (Signed)
[facility]    CSN: [id number] Arrival date & time: 02/18/16  1421     History   Chief Complaint Chief Complaint  Patient presents with  . Cough    HPI [name] is a 55 y.o. female.   HPI  Past Medical History:  Diagnosis Date  . Anxiety    takes Xanax as needed  . Arthritis   . Back pain    herniated disc  . Constipation    takes Colace daily as needed  . DDD (degenerative disc disease)   . Depression    takes Wellbutrin daily  . Depression with anxiety    r/t death of mother  . Dizziness   . GERD (gastroesophageal reflux disease)    was taking Prilosec but has been off 2-3 wks  . Headache(784.0)    occasionally  . History of bronchitis yrs ago  . Insomnia    takes Xanax nightly as needed as well as Ambien  . Muscle spasm    takes Robaxin daily as needed  . Nocturia   . Shortness of breath    JUST WHEN I MOVE - RELATED TO MY BACK PAIN  . Ulcer (HCC)   . Urinary frequency   . Vitamin D deficiency    takes Vit D weekly  . Weakness    numbness and tingling in right leg    Patient Active Problem List   Diagnosis Date Noted  . S/P lumbar spinal fusion 09/21/2015  . 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 HNP (herniated nucleus pulposus) 08/26/2013  . Low back pain 07/18/2013    Past Surgical History:  Procedure Laterality Date  . ABDOMINAL HYSTERECTOMY    . BACK SURGERY    . COLONOSCOPY    . COLONOSCOPY WITH PROPOFOL N/A 11/04/2014   Procedure: COLONOSCOPY WITH PROPOFOL;  Surgeon: Charolett Bumpers, MD;  Location: [facility];  Service: Endoscopy;  Laterality: N/A;  . EXPLORATORY LAPAROTOMY    . LUMBAR DISC SURGERY  10/14/2013   revision L5-S1  . LUMBAR LAMINECTOMY/DECOMPRESSION MICRODISCECTOMY N/A 08/26/2013   Procedure: MICRO LUMBER DECOMPRESSION L5-S1 BILATERAL;  Surgeon: Javier Docker, MD;  Location: [facility];  Service: Orthopedics;  Laterality: N/A;  . LUMBAR LAMINECTOMY/DECOMPRESSION MICRODISCECTOMY Right 10/14/2013   Procedure: REVISION  RIGHT L5-S1 DISCECTOMY ;  Surgeon: Venita Lick, MD;  Location: [facility];  Service: Orthopedics;  Laterality: Right;  . MAXIMUM ACCESS (MAS)POSTERIOR LUMBAR INTERBODY FUSION (PLIF) 1 LEVEL N/A 09/21/2015   Procedure: L5/S1 MAXIMUM ACCESS (MAS) POSTERIOR LUMBAR INTERBODY FUSION (PLIF);  Surgeon: Tia Alert, MD;  Location: [facility];  Service: Neurosurgery;  Laterality: N/A;  . OVARIAN CYST SURGERY    . SHOULDER ARTHROSCOPY WITH ROTATOR CUFF REPAIR Right   . TUBAL LIGATION  1980's    OB History    No data available       Home Medications    Prior to Admission medications   Medication Sig Start Date End Date Taking? Authorizing Provider  diazepam (VALIUM) 5 MG tablet TAKE 1 TABLET BY MOUTH EVERY 6 HOURS AS NEEDED FOR SPASMS 09/24/15  Yes Historical Provider, MD  estradiol (ESTRACE) 0.5 MG tablet Take 0.25-0.5 mg by mouth See admin instructions. 1/2 tab in the morning and 1 tab at night   Yes Historical Provider, MD  zolpidem (AMBIEN) 10 MG tablet Take 10 mg by mouth at bedtime. 08/28/15  Yes Historical Provider, MD  Halcinonide (HALOG) 0.1 % CREA Apply 1 application topically 3 (three) times daily as needed (for eczema).  Historical Provider, MD  psyllium (METAMUCIL) 58.6 % powder Take 1 packet by mouth daily.    Historical Provider, MD  Vitamin D, Ergocalciferol, (DRISDOL) 50000 units CAPS capsule Take 50,000 Units by mouth 2 (two) times a week. 07/07/15   Historical Provider, MD    Family History Family History  Problem Relation Age of Onset  . Transient ischemic attack Mother     multiple  . COPD Father     Social History Social History  Substance Use Topics  . Smoking status: Never Smoker  . Smokeless tobacco: Never Used  . Alcohol use No     Allergies   Nabumetone   Review of Systems Review of Systems   Physical Exam Triage Vital Signs ED Triage Vitals  Enc Vitals Group     BP 02/18/16 1443 101/72     Pulse Rate 02/18/16 1443 94     Resp 02/18/16 1443 16       Temp 02/18/16 1443 98.2 F (36.8 C)     Temp Source 02/18/16 1443 Oral     SpO2 02/18/16 1443 97 %     Weight --      Height --      Head Circumference --      Peak Flow --      Pain Score 02/18/16 1445 7     Pain Loc --      Pain Edu? --      Excl. in GC? --    No data found.   Updated Vital Signs BP 101/72 (BP Location: Left Arm)   Pulse 94   Temp 98.2 F (36.8 C) (Oral)   Resp 16   SpO2 97%   Visual Acuity Right Eye Distance:   Left Eye Distance:   Bilateral Distance:    Right Eye Near:   Left Eye Near:    Bilateral Near:     Physical Exam   UC Treatments / Results  Labs (all labs ordered are listed, but only abnormal results are displayed) Labs Reviewed - No data to display  EKG  EKG Interpretation None       Radiology No results found. X-rays reviewed and report per radiologist.  Procedures Procedures (including critical care time)  Medications Ordered in UC Medications - No data to display   Initial Impression / Assessment and Plan / UC Course  I have reviewed the triage vital signs and the nursing notes.  Pertinent labs & imaging results that were available during my care of the patient were reviewed by me and considered in my medical decision making (see chart for details).  Clinical Course       Final Clinical Impressions(s) / UC Diagnoses   Final diagnoses:  None    New Prescriptions New Prescriptions   No medications on file     [name], MD 02/18/16 1642

## 2016-02-18 NOTE — ED Triage Notes (Signed)
Pt said she went to see pcp on Tuesday and was given tessalon perles and flonase and told her it was viral at the time. Now still having the cough, chest feels like it is on fire, eyes running, chills, cold sweats and still not getting any better. Sore throat also when she woke up.

## 2016-02-23 DIAGNOSIS — M1711 Unilateral primary osteoarthritis, right knee: Secondary | ICD-10-CM | POA: Diagnosis not present

## 2016-03-01 DIAGNOSIS — M1711 Unilateral primary osteoarthritis, right knee: Secondary | ICD-10-CM | POA: Diagnosis not present

## 2016-03-08 DIAGNOSIS — M1711 Unilateral primary osteoarthritis, right knee: Secondary | ICD-10-CM | POA: Diagnosis not present

## 2016-04-17 DIAGNOSIS — R030 Elevated blood-pressure reading, without diagnosis of hypertension: Secondary | ICD-10-CM | POA: Diagnosis not present

## 2016-04-17 DIAGNOSIS — Z6828 Body mass index (BMI) 28.0-28.9, adult: Secondary | ICD-10-CM | POA: Diagnosis not present

## 2016-04-17 DIAGNOSIS — M961 Postlaminectomy syndrome, not elsewhere classified: Secondary | ICD-10-CM | POA: Diagnosis not present

## 2016-04-19 DIAGNOSIS — M1711 Unilateral primary osteoarthritis, right knee: Secondary | ICD-10-CM | POA: Diagnosis not present

## 2016-05-02 IMAGING — CT CT CERVICAL SPINE W/O CM
3 of 4 series · 11 of 33 positions shown, 13 images · non-contrast
Comparison: 07/26/2014

CLINICAL DATA: Fall, slipped on the floor shopping at [employer]

EXAM:
CT CERVICAL SPINE WITHOUT CONTRAST
TECHNIQUE: Multidetector CT imaging of the cervical spine was performed without
intravenous contrast. Multiplanar CT image reconstructions were also
generated.

[Series 3: c-spine st · axial · 0.25mm/px · z∈[-265,-137]mm · 3 of 97 slices shown, 4 images]
[im 17/97  soft-tissue]
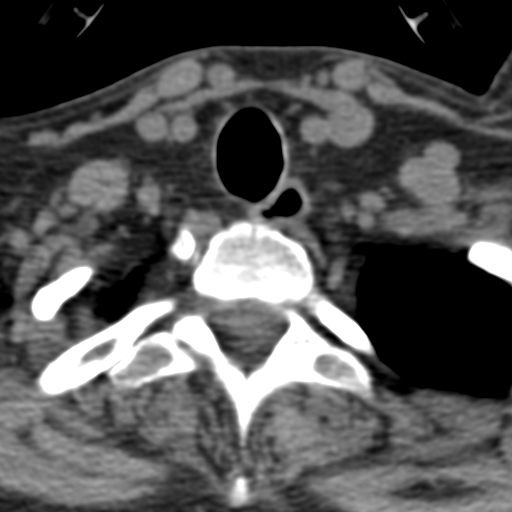
[im 17/97  bone]
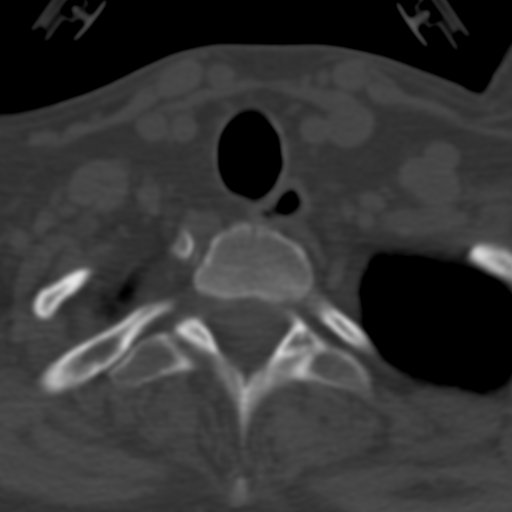
[im 49/97  bone]
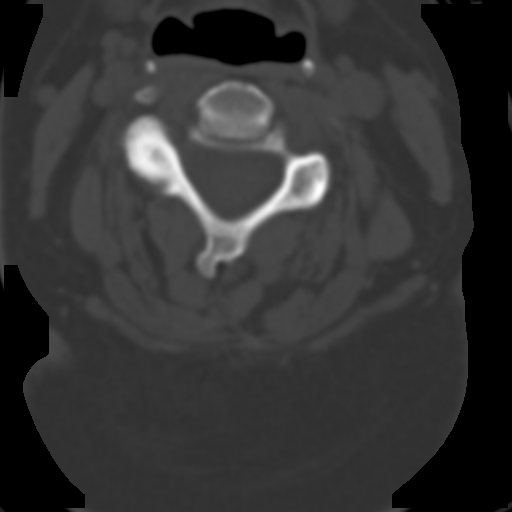
[im 81/97  bone]
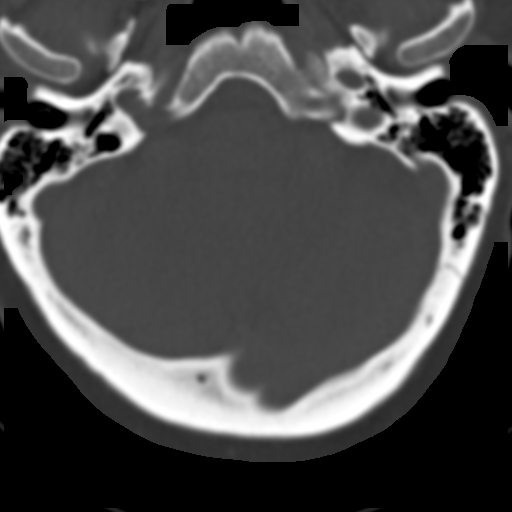

[Series 7: coronal recons · coronal · 0.28mm/px · 3 of 61 slices shown]
[im 13/61  bone]
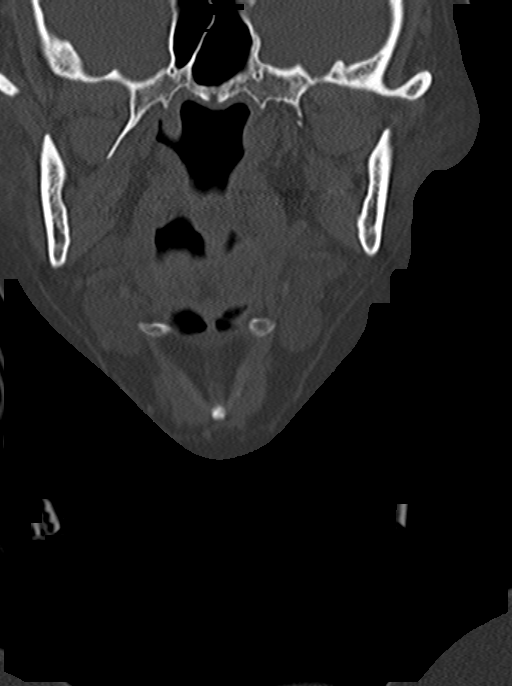
[im 25/61  bone]
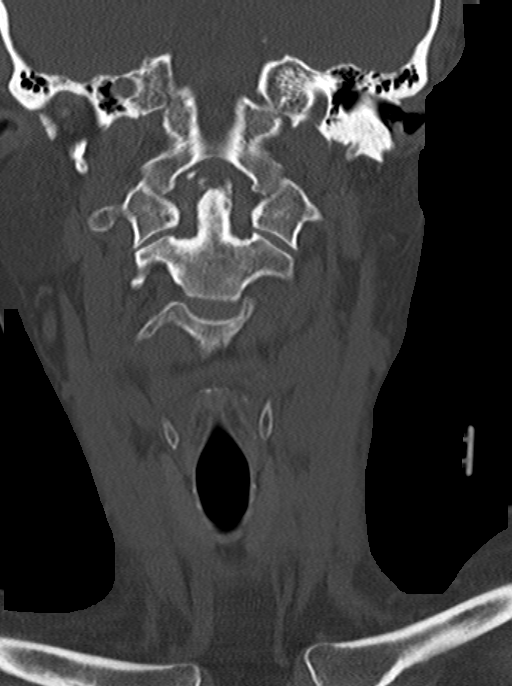
[im 37/61  bone]
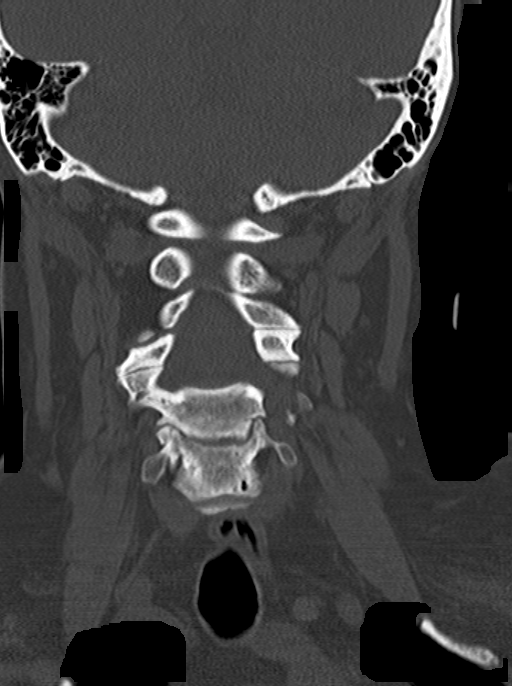

[Series 8: sagittal recons · sagittal · 0.19mm/px · 5 of 61 slices shown, 6 images]
[im 21/61  bone]
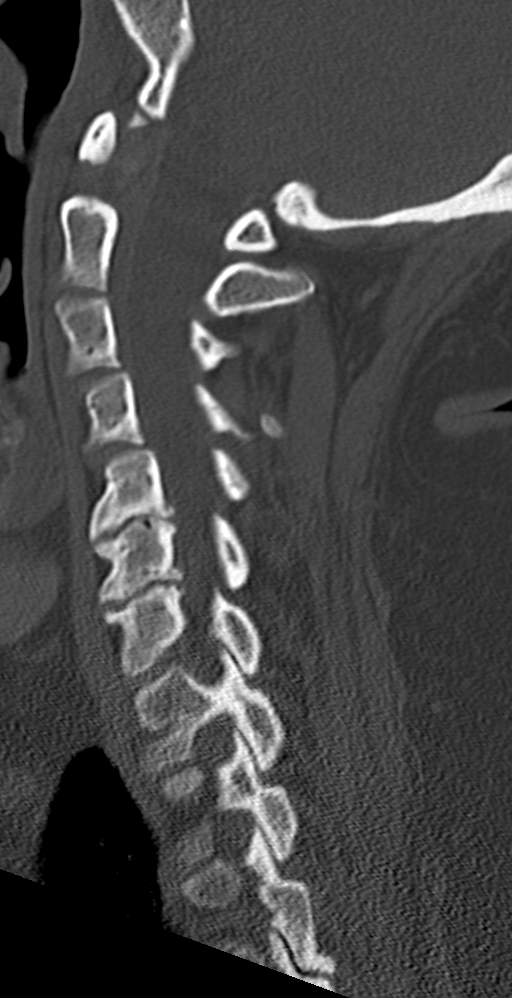
[im 26/61  bone]
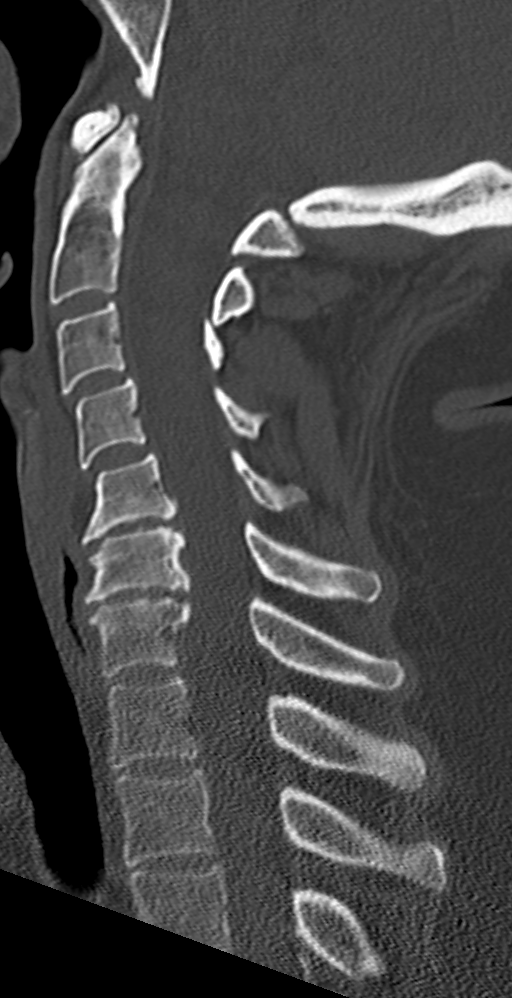
[im 31/61  soft-tissue]
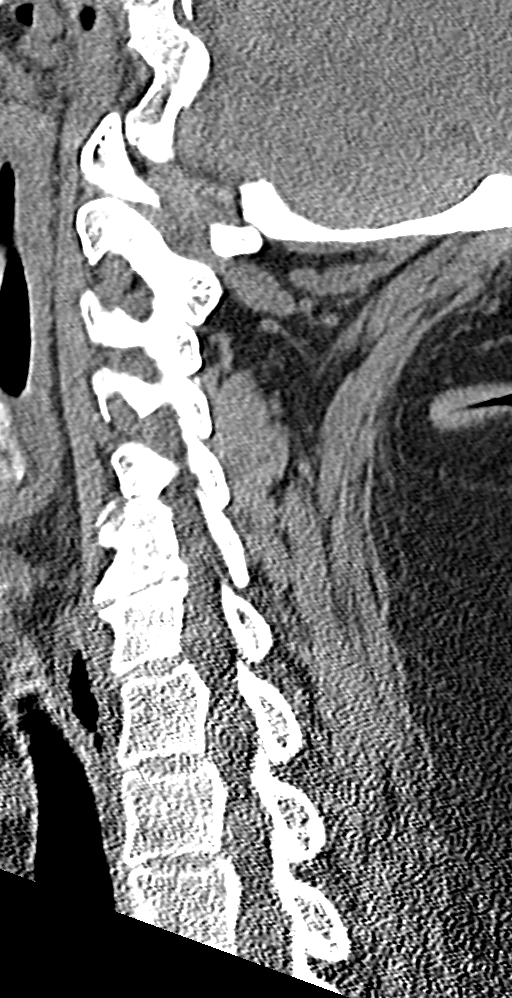
[im 31/61  bone]
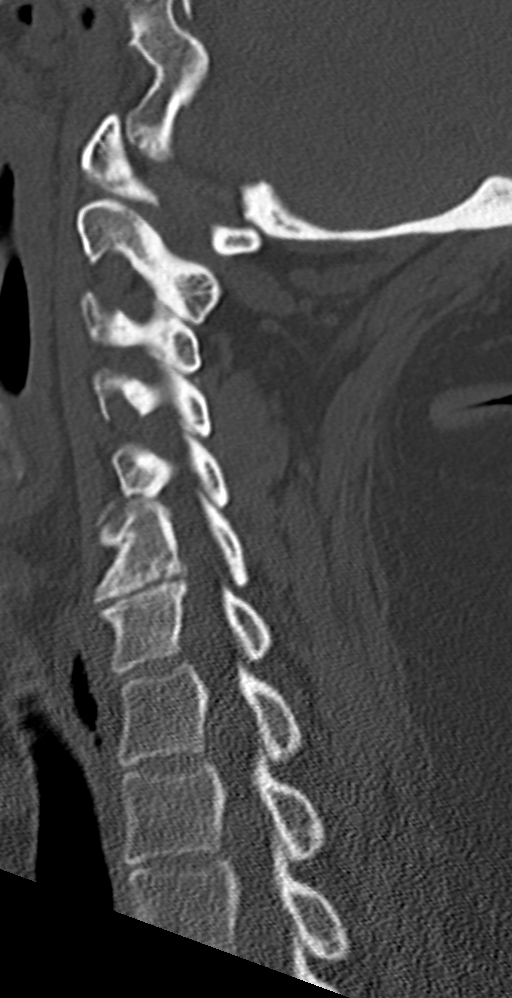
[im 36/61  bone]
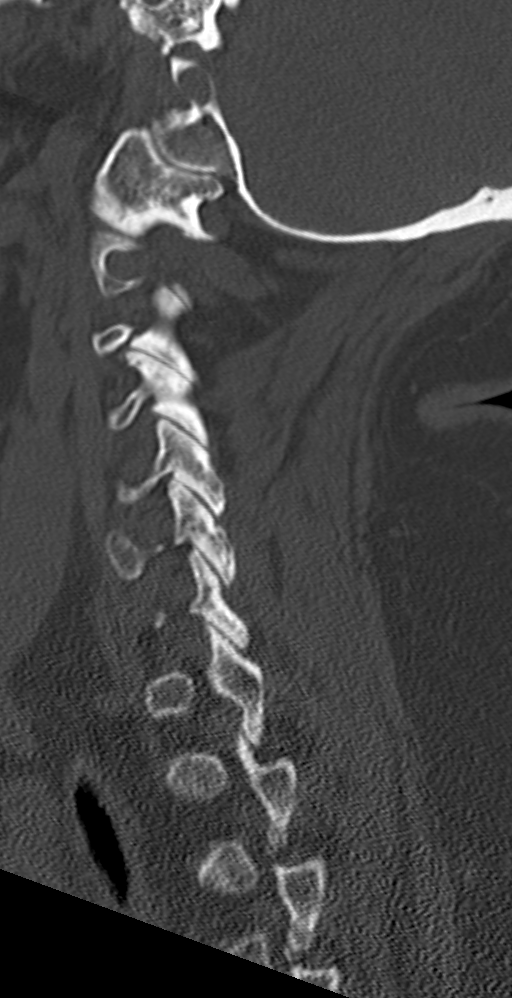
[im 41/61  bone]
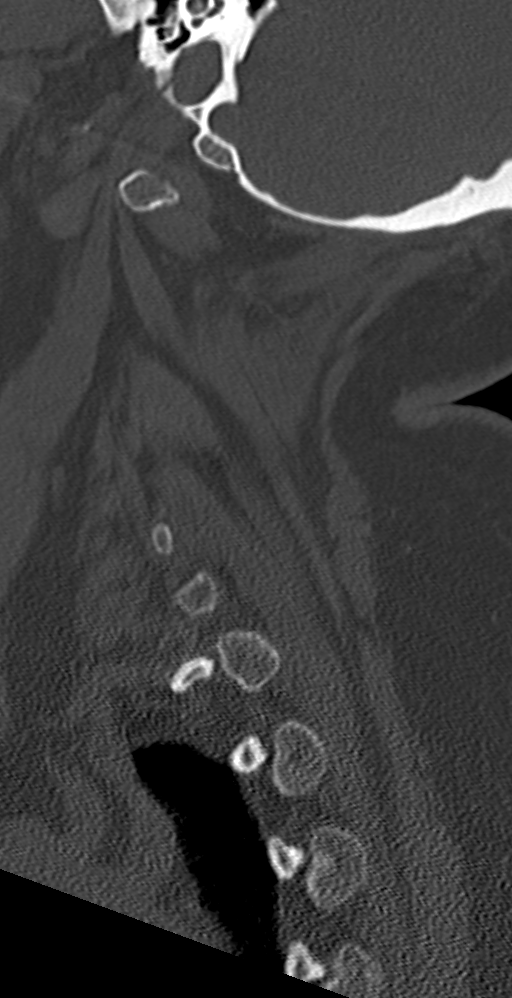

[11 of 33 positions shown; findings below may reference images not displayed]

FINDINGS: Axial images of the cervical spine shows no acute fracture or
subluxation.

There is no pneumothorax in visualized lung apices.

Computer processed images shows no acute fracture or subluxation.
Again noted degenerative changes C1-C2 articulation. Again noted
disc space flattening with mild anterior and mild posterior spurring
at C5-C6 and C6-C7 level. No prevertebral soft tissue swelling.
Cervical airway is patent.
IMPRESSION: No acute fracture or subluxation. Stable degenerative changes C1-C2
articulation. Again noted disc space flattening with mild anterior
and mild posterior spurring at C5-C6 and C6-C7 level.

## 2016-05-04 DIAGNOSIS — Z6828 Body mass index (BMI) 28.0-28.9, adult: Secondary | ICD-10-CM | POA: Diagnosis not present

## 2016-05-04 DIAGNOSIS — M961 Postlaminectomy syndrome, not elsewhere classified: Secondary | ICD-10-CM | POA: Diagnosis not present

## 2016-05-04 DIAGNOSIS — M461 Sacroiliitis, not elsewhere classified: Secondary | ICD-10-CM | POA: Diagnosis not present

## 2016-05-04 DIAGNOSIS — L280 Lichen simplex chronicus: Secondary | ICD-10-CM | POA: Diagnosis not present

## 2016-05-22 DIAGNOSIS — F410 Panic disorder [episodic paroxysmal anxiety] without agoraphobia: Secondary | ICD-10-CM | POA: Diagnosis not present

## 2016-05-22 DIAGNOSIS — G4709 Other insomnia: Secondary | ICD-10-CM | POA: Diagnosis not present

## 2016-05-23 DIAGNOSIS — Z6828 Body mass index (BMI) 28.0-28.9, adult: Secondary | ICD-10-CM | POA: Diagnosis not present

## 2016-05-23 DIAGNOSIS — M461 Sacroiliitis, not elsewhere classified: Secondary | ICD-10-CM | POA: Diagnosis not present

## 2016-05-24 DIAGNOSIS — F1120 Opioid dependence, uncomplicated: Secondary | ICD-10-CM | POA: Diagnosis not present

## 2016-05-24 DIAGNOSIS — M961 Postlaminectomy syndrome, not elsewhere classified: Secondary | ICD-10-CM | POA: Diagnosis not present

## 2016-05-24 DIAGNOSIS — Z79899 Other long term (current) drug therapy: Secondary | ICD-10-CM | POA: Diagnosis not present

## 2016-05-24 DIAGNOSIS — M461 Sacroiliitis, not elsewhere classified: Secondary | ICD-10-CM | POA: Diagnosis not present

## 2016-05-28 DIAGNOSIS — L239 Allergic contact dermatitis, unspecified cause: Secondary | ICD-10-CM | POA: Diagnosis not present

## 2016-05-28 DIAGNOSIS — Z0182 Encounter for allergy testing: Secondary | ICD-10-CM | POA: Diagnosis not present

## 2016-06-01 DIAGNOSIS — Z0182 Encounter for allergy testing: Secondary | ICD-10-CM | POA: Diagnosis not present

## 2016-06-01 DIAGNOSIS — L309 Dermatitis, unspecified: Secondary | ICD-10-CM | POA: Diagnosis not present

## 2016-08-14 DIAGNOSIS — E782 Mixed hyperlipidemia: Secondary | ICD-10-CM | POA: Diagnosis not present

## 2016-08-14 DIAGNOSIS — E559 Vitamin D deficiency, unspecified: Secondary | ICD-10-CM | POA: Diagnosis not present

## 2016-08-16 ENCOUNTER — Other Ambulatory Visit: Payer: Self-pay | Admitting: Family Medicine

## 2016-08-16 DIAGNOSIS — Z1231 Encounter for screening mammogram for malignant neoplasm of breast: Secondary | ICD-10-CM

## 2016-08-17 DIAGNOSIS — Z0000 Encounter for general adult medical examination without abnormal findings: Secondary | ICD-10-CM | POA: Diagnosis not present

## 2016-08-20 DIAGNOSIS — H6123 Impacted cerumen, bilateral: Secondary | ICD-10-CM | POA: Diagnosis not present

## 2016-08-20 DIAGNOSIS — H9203 Otalgia, bilateral: Secondary | ICD-10-CM | POA: Diagnosis not present

## 2016-08-24 DIAGNOSIS — L820 Inflamed seborrheic keratosis: Secondary | ICD-10-CM | POA: Diagnosis not present

## 2016-08-28 ENCOUNTER — Ambulatory Visit: Payer: BLUE CROSS/BLUE SHIELD

## 2016-09-17 DIAGNOSIS — M961 Postlaminectomy syndrome, not elsewhere classified: Secondary | ICD-10-CM | POA: Diagnosis not present

## 2016-09-25 ENCOUNTER — Ambulatory Visit
Admission: RE | Admit: 2016-09-25 | Discharge: 2016-09-25 | Disposition: A | Discharge status: Home / Self Care | Payer: BLUE CROSS/BLUE SHIELD | Source: Ambulatory Visit | Attending: Family Medicine | Admitting: Family Medicine

## 2016-09-25 DIAGNOSIS — Z1231 Encounter for screening mammogram for malignant neoplasm of breast: Secondary | ICD-10-CM

## 2016-11-01 DIAGNOSIS — M1712 Unilateral primary osteoarthritis, left knee: Secondary | ICD-10-CM | POA: Diagnosis not present

## 2016-11-01 DIAGNOSIS — M25562 Pain in left knee: Secondary | ICD-10-CM | POA: Diagnosis not present

## 2016-11-16 DIAGNOSIS — E559 Vitamin D deficiency, unspecified: Secondary | ICD-10-CM | POA: Diagnosis not present

## 2016-11-20 DIAGNOSIS — F410 Panic disorder [episodic paroxysmal anxiety] without agoraphobia: Secondary | ICD-10-CM | POA: Diagnosis not present

## 2016-11-20 DIAGNOSIS — G4709 Other insomnia: Secondary | ICD-10-CM | POA: Diagnosis not present

## 2016-11-30 ENCOUNTER — Emergency Department (HOSPITAL_COMMUNITY): Payer: BLUE CROSS/BLUE SHIELD

## 2016-11-30 ENCOUNTER — Observation Stay (HOSPITAL_COMMUNITY)
Admission: EM | Admit: 2016-11-30 | Discharge: 2016-12-02 | Disposition: A | Discharge status: Home / Self Care | Payer: BLUE CROSS/BLUE SHIELD | Attending: General Surgery | Admitting: General Surgery

## 2016-11-30 ENCOUNTER — Ambulatory Visit (HOSPITAL_COMMUNITY): Payer: BLUE CROSS/BLUE SHIELD

## 2016-11-30 ENCOUNTER — Encounter (HOSPITAL_COMMUNITY): Payer: Self-pay | Admitting: Emergency Medicine

## 2016-11-30 DIAGNOSIS — R110 Nausea: Secondary | ICD-10-CM

## 2016-11-30 DIAGNOSIS — M1990 Unspecified osteoarthritis, unspecified site: Secondary | ICD-10-CM | POA: Insufficient documentation

## 2016-11-30 DIAGNOSIS — Z79899 Other long term (current) drug therapy: Secondary | ICD-10-CM | POA: Insufficient documentation

## 2016-11-30 DIAGNOSIS — F329 Major depressive disorder, single episode, unspecified: Secondary | ICD-10-CM | POA: Diagnosis not present

## 2016-11-30 DIAGNOSIS — G4700 Insomnia, unspecified: Secondary | ICD-10-CM | POA: Insufficient documentation

## 2016-11-30 DIAGNOSIS — R51 Headache: Secondary | ICD-10-CM | POA: Insufficient documentation

## 2016-11-30 DIAGNOSIS — K219 Gastro-esophageal reflux disease without esophagitis: Secondary | ICD-10-CM | POA: Diagnosis not present

## 2016-11-30 DIAGNOSIS — F419 Anxiety disorder, unspecified: Secondary | ICD-10-CM | POA: Diagnosis not present

## 2016-11-30 DIAGNOSIS — E559 Vitamin D deficiency, unspecified: Secondary | ICD-10-CM | POA: Insufficient documentation

## 2016-11-30 DIAGNOSIS — M5440 Lumbago with sciatica, unspecified side: Secondary | ICD-10-CM | POA: Diagnosis not present

## 2016-11-30 DIAGNOSIS — K8020 Calculus of gallbladder without cholecystitis without obstruction: Secondary | ICD-10-CM | POA: Diagnosis present

## 2016-11-30 DIAGNOSIS — R072 Precordial pain: Secondary | ICD-10-CM

## 2016-11-30 DIAGNOSIS — R52 Pain, unspecified: Secondary | ICD-10-CM

## 2016-11-30 DIAGNOSIS — R0602 Shortness of breath: Secondary | ICD-10-CM

## 2016-11-30 DIAGNOSIS — K8010 Calculus of gallbladder with chronic cholecystitis without obstruction: Principal | ICD-10-CM | POA: Insufficient documentation

## 2016-11-30 DIAGNOSIS — R079 Chest pain, unspecified: Secondary | ICD-10-CM | POA: Diagnosis not present

## 2016-11-30 DIAGNOSIS — G8929 Other chronic pain: Secondary | ICD-10-CM | POA: Insufficient documentation

## 2016-11-30 LAB — URINALYSIS, ROUTINE W REFLEX MICROSCOPIC
Bilirubin Urine: NEGATIVE
GLUCOSE, UA: NEGATIVE mg/dL
Hgb urine dipstick: NEGATIVE
Ketones, ur: NEGATIVE mg/dL
LEUKOCYTES UA: NEGATIVE
Nitrite: NEGATIVE
PROTEIN: NEGATIVE mg/dL
Specific Gravity, Urine: 1.003 — ABNORMAL LOW (ref 1.005–1.030)
pH: 7 (ref 5.0–8.0)

## 2016-11-30 LAB — BASIC METABOLIC PANEL
ANION GAP: 9 (ref 5–15)
BUN: 17 mg/dL (ref 6–20)
CO2: 25 mmol/L (ref 22–32)
Calcium: 9.1 mg/dL (ref 8.9–10.3)
Chloride: 107 mmol/L (ref 101–111)
Creatinine, Ser: 0.62 mg/dL (ref 0.44–1.00)
GFR calc Af Amer: >60 mL/min (ref >60)
GFR calc non Af Amer: >60 mL/min (ref >60)
GLUCOSE: 91 mg/dL (ref 65–99)
POTASSIUM: 4 mmol/L (ref 3.5–5.1)
Sodium: 141 mmol/L (ref 135–145)

## 2016-11-30 LAB — HEPATIC FUNCTION PANEL
ALBUMIN: 3.5 g/dL (ref 3.5–5.0)
ALT: 27 U/L (ref 14–54)
AST: 23 U/L (ref 15–41)
Alkaline Phosphatase: 74 U/L (ref 38–126)
Total Bilirubin: 0.4 mg/dL (ref 0.3–1.2)
Total Protein: 6.7 g/dL (ref 6.5–8.1)

## 2016-11-30 LAB — CBC
HEMATOCRIT: 39.6 % (ref 36.0–46.0)
HEMOGLOBIN: 13.2 g/dL (ref 12.0–15.0)
MCH: 30.3 pg (ref 26.0–34.0)
MCHC: 33.3 g/dL (ref 30.0–36.0)
MCV: 90.8 fL (ref 78.0–100.0)
Platelets: 238 10*3/uL (ref 150–400)
RBC: 4.36 MIL/uL (ref 3.87–5.11)
RDW: 12.1 % (ref 11.5–15.5)
WBC: 6.4 10*3/uL (ref 4.0–10.5)

## 2016-11-30 LAB — I-STAT TROPONIN, ED
TROPONIN I, POC: 0 ng/mL (ref 0.00–0.08)
Troponin i, poc: 0 ng/mL (ref 0.00–0.08)

## 2016-11-30 LAB — D-DIMER, QUANTITATIVE: D-Dimer, Quant: 0.58 ug/mL-FEU — ABNORMAL HIGH (ref 0.00–0.50)

## 2016-11-30 LAB — LIPASE, BLOOD: Lipase: 27 U/L (ref 11–51)

## 2016-11-30 MED ORDER — DEXTROSE 5 % IV SOLN
2.0000 g | INTRAVENOUS | Status: DC
  Administered 2016-12-01 (×2): 2 g via INTRAVENOUS
  Filled 2016-11-30 (×2): qty 2

## 2016-11-30 MED ORDER — HYDROMORPHONE HCL 1 MG/ML IJ SOLN
1.0000 mg | INTRAMUSCULAR | Status: DC | PRN
  Administered 2016-12-02: 1 mg via INTRAVENOUS
  Filled 2016-11-30: qty 1

## 2016-11-30 MED ORDER — ENOXAPARIN SODIUM 40 MG/0.4ML ~~LOC~~ SOLN: 40.0000 mg | SUBCUTANEOUS | Status: DC

## 2016-11-30 MED ORDER — ACETAMINOPHEN 325 MG PO TABS
650.0000 mg | ORAL_TABLET | Freq: Four times a day (QID) | ORAL | Status: DC | PRN
  Administered 2016-12-02: 650 mg via ORAL
  Filled 2016-11-30: qty 2

## 2016-11-30 MED ORDER — ZOLPIDEM TARTRATE 5 MG PO TABS
5.0000 mg | ORAL_TABLET | Freq: Every day | ORAL | Status: DC
  Administered 2016-11-30 – 2016-12-01 (×2): 5 mg via ORAL
  Filled 2016-11-30 (×2): qty 1

## 2016-11-30 MED ORDER — SODIUM CHLORIDE 0.9 % IV BOLUS (SEPSIS)
1000.0000 mL | Freq: Once | INTRAVENOUS | Status: AC
  Administered 2016-11-30: 1000 mL via INTRAVENOUS

## 2016-11-30 MED ORDER — MORPHINE SULFATE (PF) 4 MG/ML IV SOLN
4.0000 mg | Freq: Once | INTRAVENOUS | Status: AC
  Administered 2016-11-30: 4 mg via INTRAVENOUS
  Filled 2016-11-30: qty 1

## 2016-11-30 MED ORDER — PANTOPRAZOLE SODIUM 40 MG IV SOLR
40.0000 mg | Freq: Every day | INTRAVENOUS | Status: DC
  Administered 2016-11-30 – 2016-12-01 (×2): 40 mg via INTRAVENOUS
  Filled 2016-11-30 (×2): qty 40

## 2016-11-30 MED ORDER — HYDROMORPHONE HCL 1 MG/ML IJ SOLN
0.5000 mg | Freq: Once | INTRAMUSCULAR | Status: AC
  Administered 2016-11-30: 0.5 mg via INTRAVENOUS
  Filled 2016-11-30: qty 1

## 2016-11-30 MED ORDER — DIPHENHYDRAMINE HCL 25 MG PO CAPS: 25.0000 mg | ORAL_CAPSULE | Freq: Four times a day (QID) | ORAL | Status: DC | PRN

## 2016-11-30 MED ORDER — ONDANSETRON 4 MG PO TBDP: 4.0000 mg | ORAL_TABLET | Freq: Four times a day (QID) | ORAL | Status: DC | PRN

## 2016-11-30 MED ORDER — PROMETHAZINE HCL 25 MG/ML IJ SOLN
25.0000 mg | Freq: Once | INTRAMUSCULAR | Status: AC
  Administered 2016-11-30: 25 mg via INTRAVENOUS
  Filled 2016-11-30: qty 1

## 2016-11-30 MED ORDER — SODIUM CHLORIDE 0.9 % IV SOLN
INTRAVENOUS | Status: DC
  Administered 2016-11-30 – 2016-12-01 (×3): via INTRAVENOUS

## 2016-11-30 MED ORDER — DIPHENHYDRAMINE HCL 50 MG/ML IJ SOLN: 25.0000 mg | Freq: Four times a day (QID) | INTRAMUSCULAR | Status: DC | PRN

## 2016-11-30 MED ORDER — ESTRADIOL 0.5 MG PO TABS
0.5000 mg | ORAL_TABLET | Freq: Every day | ORAL | Status: DC
  Administered 2016-11-30 – 2016-12-01 (×2): 0.5 mg via ORAL
  Filled 2016-11-30 (×2): qty 1

## 2016-11-30 MED ORDER — ONDANSETRON HCL 4 MG/2ML IJ SOLN: 4.0000 mg | Freq: Four times a day (QID) | INTRAMUSCULAR | Status: DC | PRN

## 2016-11-30 MED ORDER — ACETAMINOPHEN 650 MG RE SUPP: 650.0000 mg | Freq: Four times a day (QID) | RECTAL | Status: DC | PRN

## 2016-11-30 MED ORDER — FAMOTIDINE IN NACL 20-0.9 MG/50ML-% IV SOLN
20.0000 mg | Freq: Once | INTRAVENOUS | Status: AC
  Administered 2016-11-30: 20 mg via INTRAVENOUS
  Filled 2016-11-30: qty 50

## 2016-11-30 MED ORDER — GI COCKTAIL ~~LOC~~
30.0000 mL | Freq: Once | ORAL | Status: AC
  Administered 2016-11-30: 30 mL via ORAL
  Filled 2016-11-30: qty 30

## 2016-11-30 NOTE — ED Notes (Signed)
Clear liquid tray ordered 

## 2016-11-30 NOTE — H&P (Signed)
[name] is an 56 y.o. female.   Chief Complaint: Epigastric pain, nausea HPI: 56 y.o. female with a PMHx of anxiety, GERD, headaches, DDD/chronic back pain, chronic SOB related to back pain, paresthesias/sciatica, and other medical conditions listed below, and PSHx of back surgeries and abd hysterectomy, who presents to the ED with complaints of sudden onset chest pain that began at 7:30 AM after she awoke from rest. She states that she woke up with a sharp pressure sensation in the center of her chest, felt nauseated, and lightheaded. She attempted to get up to go to the bathroom and then laid back down which did not help the symptoms. She tried drinking a protein shake which also did not help. Eventually her family members encouraged her to seek emergent medical care for her symptoms. She describes her pain is 9/10 constant sharp and pressure-like central chest pain that occasionally radiates to the left chest, worse with movement or deep inspiration, and unrelieved with 325 mg aspirin and one nitroglycerin given en route. She states that she did notice when she got up to go to the bathroom that she was short of breath with exertion however denies any shortness of breath at rest. She also mentions that she's had a cough with clear sputum production for 1 week as well as sinus congestion related to allergies. She was given Zofran which helped her nausea. She is nonsmoker. Denies use of alcohol or NSAID due to gastric ulcer. She is on estradiol for hormone replacement therapy which she has been on for quite some time. Positive family history of cardiac disease with CAD in her father who had multiple stents placed, no history of MI that she's aware of in any of her family members. Pt denies personal hx of DM2, HTN, HLD, or any other medical conditions aside from those listed above. Last PO intake was 11am;   PCP is Dr. Addison Lank at [facility].  She reports similar but milder episodes over the last year,  possibly related to spicy foods.  She has been on a stricter diet over the last few months.   Past Medical History:  Diagnosis Date  . Anxiety    takes Xanax as needed  . Arthritis   . Back pain    herniated disc  . Constipation    takes Colace daily as needed  . DDD (degenerative disc disease)   . Depression    takes Wellbutrin daily  . Depression with anxiety    r/t death of mother  . Dizziness   . GERD (gastroesophageal reflux disease)    was taking Prilosec but has been off 2-3 wks  . Headache(784.0)    occasionally  . History of bronchitis yrs ago  . Insomnia    takes Xanax nightly as needed as well as Ambien  . Muscle spasm    takes Robaxin daily as needed  . Nocturia   . Shortness of breath    JUST WHEN I MOVE - RELATED TO MY BACK PAIN  . Ulcer   . Urinary frequency   . Vitamin D deficiency    takes Vit D weekly  . Weakness    numbness and tingling in right leg    Past Surgical History:  Procedure Laterality Date  . ABDOMINAL HYSTERECTOMY    . BACK SURGERY    . COLONOSCOPY    . COLONOSCOPY WITH PROPOFOL N/A 11/04/2014   Procedure: COLONOSCOPY WITH PROPOFOL;  Surgeon: Garlan Fair, MD;  Location: [facility];  Service: Endoscopy;  Laterality: N/A;  . EXPLORATORY LAPAROTOMY    . LUMBAR DISC SURGERY  10/14/2013   revision L5-S1  . LUMBAR LAMINECTOMY/DECOMPRESSION MICRODISCECTOMY N/A 08/26/2013   Procedure: MICRO LUMBER DECOMPRESSION L5-S1 BILATERAL;  Surgeon: Johnn Hai, MD;  Location: [facility];  Service: Orthopedics;  Laterality: N/A;  . LUMBAR LAMINECTOMY/DECOMPRESSION MICRODISCECTOMY Right 10/14/2013   Procedure: REVISION RIGHT L5-S1 DISCECTOMY ;  Surgeon: Melina Schools, MD;  Location: [facility];  Service: Orthopedics;  Laterality: Right;  . MAXIMUM ACCESS (MAS)POSTERIOR LUMBAR INTERBODY FUSION (PLIF) 1 LEVEL N/A 09/21/2015   Procedure: L5/S1 MAXIMUM ACCESS (MAS) POSTERIOR LUMBAR INTERBODY FUSION (PLIF);  Surgeon: Eustace Moore, MD;  Location: [facility];   Service: Neurosurgery;  Laterality: N/A;  . OVARIAN CYST SURGERY    . SHOULDER ARTHROSCOPY WITH ROTATOR CUFF REPAIR Right   . TUBAL LIGATION  1980's    Family History  Problem Relation Age of Onset  . Transient ischemic attack Mother        multiple  . COPD Father    Social History:  reports that she has never smoked. She has never used smokeless tobacco. She reports that she does not drink alcohol or use drugs.  Allergies:  Allergies  Allergen Reactions  . Nabumetone Diarrhea    Prior to Admission medications   Medication Sig Start Date End Date Taking? Authorizing Provider  amoxicillin-clavulanate (AUGMENTIN) 875-125 MG tablet Take 1 tablet by mouth 2 (two) times daily. 02/18/16   Billy Fischer, MD  diazepam (VALIUM) 5 MG tablet TAKE 1 TABLET BY MOUTH EVERY 6 HOURS AS NEEDED FOR SPASMS 09/24/15   [provider]  estradiol (ESTRACE) 0.5 MG tablet Take 0.25-0.5 mg by mouth See admin instructions. 1/2 tab in the morning and 1 tab at night    [provider]  Halcinonide (HALOG) 0.1 % CREA Apply 1 application topically 3 (three) times daily as needed (for eczema).    [provider]  ipratropium (ATROVENT) 0.06 % nasal spray Place 2 sprays into both nostrils 4 (four) times daily. 02/18/16   Billy Fischer, MD  psyllium (METAMUCIL) 58.6 % powder Take 1 packet by mouth daily.    [provider]  Vitamin D, Ergocalciferol, (DRISDOL) 50000 units CAPS capsule Take 50,000 Units by mouth 2 (two) times a week. 07/07/15   [provider]  zolpidem (AMBIEN) 10 MG tablet Take 10 mg by mouth at bedtime. 08/28/15   [provider]     Results for orders placed or performed during the hospital encounter of 11/30/16 (from the past 48 hour(s))  Urinalysis, Routine w reflex microscopic     Status: Abnormal   Collection Time: 11/30/16 12:40 PM  Result Value Ref Range   Color, Urine STRAW (A) YELLOW   APPearance CLEAR CLEAR   Specific Gravity,  Urine 1.003 (L) 1.005 - 1.030   pH 7.0 5.0 - 8.0   Glucose, UA NEGATIVE NEGATIVE mg/dL   Hgb urine dipstick NEGATIVE NEGATIVE   Bilirubin Urine NEGATIVE NEGATIVE   Ketones, ur NEGATIVE NEGATIVE mg/dL   Protein, ur NEGATIVE NEGATIVE mg/dL   Nitrite NEGATIVE NEGATIVE   Leukocytes, UA NEGATIVE NEGATIVE  Basic metabolic panel     Status: None   Collection Time: 11/30/16 12:45 PM  Result Value Ref Range   Sodium 141 135 - 145 mmol/L   Potassium 4.0 3.5 - 5.1 mmol/L   Chloride 107 101 - 111 mmol/L   CO2 25 22 - 32 mmol/L   Glucose, Bld 91 65 -  99 mg/dL   BUN 17 6 - 20 mg/dL   Creatinine, Ser 0.62 0.44 - 1.00 mg/dL   Calcium 9.1 8.9 - 10.3 mg/dL   GFR calc non Af Amer >60 >60 mL/min   GFR calc Af Amer >60 >60 mL/min    Comment: (NOTE) The eGFR has been calculated using the CKD EPI equation. This calculation has not been validated in all clinical situations. eGFR's persistently <60 mL/min signify possible Chronic Kidney Disease.    Anion gap 9 5 - 15  CBC     Status: None   Collection Time: 11/30/16 12:45 PM  Result Value Ref Range   WBC 6.4 4.0 - 10.5 K/uL   RBC 4.36 3.87 - 5.11 MIL/uL   Hemoglobin 13.2 12.0 - 15.0 g/dL   HCT 39.6 36.0 - 46.0 %   MCV 90.8 78.0 - 100.0 fL   MCH 30.3 26.0 - 34.0 pg   MCHC 33.3 30.0 - 36.0 g/dL   RDW 12.1 11.5 - 15.5 %   Platelets 238 150 - 400 K/uL  I-stat troponin, ED     Status: None   Collection Time: 11/30/16 12:59 PM  Result Value Ref Range   Troponin i, poc 0.00 0.00 - 0.08 ng/mL   Comment 3            Comment: Due to the release kinetics of cTnI, a negative result within the first hours of the onset of symptoms does not rule out myocardial infarction with certainty. If myocardial infarction is still suspected, repeat the test at appropriate intervals.   Hepatic function panel     Status: Abnormal   Collection Time: 11/30/16  1:21 PM  Result Value Ref Range   Total Protein 6.7 6.5 - 8.1 g/dL   Albumin 3.5 3.5 - 5.0 g/dL   AST 23  15 - 41 U/L   ALT 27 14 - 54 U/L   Alkaline Phosphatase 74 38 - 126 U/L   Total Bilirubin 0.4 0.3 - 1.2 mg/dL   Bilirubin, Direct <0.1 (L) 0.1 - 0.5 mg/dL   Indirect Bilirubin NOT CALCULATED 0.3 - 0.9 mg/dL  Lipase, blood     Status: None   Collection Time: 11/30/16  1:21 PM  Result Value Ref Range   Lipase 27 11 - 51 U/L  D-dimer, quantitative (not at [facility])     Status: Abnormal   Collection Time: 11/30/16  1:21 PM  Result Value Ref Range   D-Dimer, Quant 0.58 (H) 0.00 - 0.50 ug/mL-FEU    Comment: (NOTE) At the manufacturer cut-off of 0.50 ug/mL FEU, this assay has been documented to exclude PE with a sensitivity and negative predictive value of 97 to 99%.  At this time, this assay has not been approved by the FDA to exclude DVT/VTE. Results should be correlated with clinical presentation.   I-stat troponin, ED     Status: None   Collection Time: 11/30/16  4:17 PM  Result Value Ref Range   Troponin i, poc 0.00 0.00 - 0.08 ng/mL   Comment 3            Comment: Due to the release kinetics of cTnI, a negative result within the first hours of the onset of symptoms does not rule out myocardial infarction with certainty. If myocardial infarction is still suspected, repeat the test at appropriate intervals.    Dg Chest 2 View  Result Date: 11/30/2016 CLINICAL DATA:  Chest pain, blurred vision, and headache beginning today. EXAM: CHEST  2 VIEW  COMPARISON:  02/18/2016 FINDINGS: The heart size and mediastinal contours are within normal limits. Both lungs are clear. The visualized skeletal structures are unremarkable. IMPRESSION: Negative.  No active cardiopulmonary disease. Electronically Signed   By: Earle Gell M.D.   On: 11/30/2016 14:43   US Abdomen Complete  Result Date: 11/30/2016 CLINICAL DATA:  Epigastric and right upper quadrant abdominal pain and nausea since 7:30 a.m. today. Positive Murphy's sign. EXAM: ABDOMEN ULTRASOUND COMPLETE COMPARISON:  Abdomen and pelvis CT dated  11/11/2014. FINDINGS: Gallbladder: Multiple gallstones in the gallbladder. The largest measures 1.2 cm in maximum diameter. No gallbladder wall thickening or pericholecystic fluid. The patient has received pain medication, making it difficult to assess for a sonographic Murphy's sign. Common bile duct: Diameter: 3.2 mm Liver: Mildly echogenic. Portal vein is patent on color Doppler imaging with normal direction of blood flow towards the liver. IVC: No abnormality visualized. Pancreas: Not visualized due to overlying bowel gas. Spleen: Size and appearance within normal limits. Right Kidney: Length: 11.0 cm. Echogenicity within normal limits. No mass or hydronephrosis visualized. Left Kidney: Length: 11.3 cm. Echogenicity within normal limits. 8 mm oval brightly echogenic focus in the mid left kidney cortex, not seen on the previous CT. No mass or hydronephrosis visualized. Abdominal aorta: No aneurysm visualized. Other findings: None. IMPRESSION: 1. Cholelithiasis without evidence of acute cholecystitis. 2. Interval 8 mm probable mid left renal calculus, possibly in a calyx. 3. Mildly echogenic liver, most likely due to steatosis. 4. Electronically Signed   By: Claudie Revering M.D.   On: 11/30/2016 15:26    Review of Systems  Constitutional: Negative for weight loss.  HENT: Negative for ear discharge, ear pain, hearing loss and tinnitus.   Eyes: Negative for blurred vision, double vision, photophobia and pain.  Respiratory: Negative for cough, sputum production and shortness of breath.   Cardiovascular: Positive for chest pain.  Gastrointestinal: Positive for abdominal pain, diarrhea, nausea and vomiting.  Genitourinary: Negative for dysuria, flank pain, frequency and urgency.  Musculoskeletal: Negative for back pain, falls, joint pain, myalgias and neck pain.  Neurological: Negative for dizziness, tingling, sensory change, focal weakness, loss of consciousness and headaches.  Endo/Heme/Allergies: Does not  bruise/bleed easily.  Psychiatric/Behavioral: Negative for depression, memory loss and substance abuse. The patient is not nervous/anxious.     Blood pressure 107/65, pulse 73, temperature 98.4 F (36.9 C), temperature source Oral, resp. rate 15, height _0  (1.651 m), weight 72.1 kg (159 lb), SpO2 95 %. Physical Exam  WDWN in NAD Eyes:  Pupils equal, round; sclera anicteric HENT:  Oral mucosa moist; good dentition  Neck:  No masses palpated, no thyromegaly Lungs:  CTA bilaterally; normal respiratory effort CV:  Regular rate and rhythm; no murmurs; extremities well-perfused with no edema Abd:  +bowel sounds, soft, tender in epigastrium and RUQ; no palpable organomegaly; no palpable hernias Skin:  Warm, dry; no sign of jaundice Psychiatric - alert and oriented x 4; calm mood and affect  Assessment/Plan Symptomatic cholelithiasis - possible gallstone impacted in neck of GB.  Pain is not controlled with IV pain meds.  Admit for IV antibiotics, laparoscopic choelcystectomy tomorrow by Dr. Grandville Silos.  Maia Petties., MD 11/30/2016, 7:24 PM

## 2016-11-30 NOTE — ED Triage Notes (Addendum)
Pt in from home via [facility] with c/o sharp, constant central cp that woke her at 0800 this am. Pain is 9/10, radiates to L chest, worse with inspiration. Pt states she is nauseous, denies sob. Given 324 ASA and 1 NTG en route, stated no relief. VSS, a&ox4

## 2016-11-30 NOTE — ED Provider Notes (Signed)
[facility]-EMERGENCY DEPT Provider Note   CSN: [id number] Arrival date & time: 11/30/16  1233     History   Chief Complaint Chief Complaint  Patient presents with  . Chest Pain    HPI [name] is a 56 y.o. female with a PMHx of anxiety, GERD, headaches, DDD/chronic back pain, chronic SOB related to back pain, paresthesias/sciatica, and other medical conditions listed below, and PSHx of back surgeries and abd hysterectomy, who presents to the ED with complaints of sudden onset chest pain that began at 7:30 AM after she awoke from rest. She states that she woke up with a sharp pressure sensation in the center of her chest, felt nauseated, and lightheaded. She attempted to get up to go to the bathroom and then laid back down which did not help the symptoms. She tried drinking a protein shake which also did not help. Eventually her family members encouraged her to seek emergent medical care for her symptoms. She describes her pain is 9/10 constant sharp and pressure-like central chest pain that occasionally radiates to the left chest, worse with movement or deep inspiration, and unrelieved with 325 mg aspirin and one nitroglycerin given en route. She states that she did notice when she got up to go to the bathroom that she was short of breath with exertion however denies any shortness of breath at rest. She also mentions that she's had a cough with clear sputum production for 1 week as well as sinus congestion related to allergies. She was given Zofran which helped her nausea. She is nonsmoker. Denies use of alcohol or NSAID due to gastric ulcer. She is on estradiol for hormone replacement therapy which she has been on for quite some time. Positive family history of cardiac disease with CAD in her father who had multiple stents placed, no history of MI that she's aware of in any of her family members. Pt denies personal hx of DM2, HTN, HLD, or any other medical conditions aside from those listed above.  Last PO intake was 11am; PCP is Dr. Corliss Blacker at [place].  She denies diaphoresis, fevers, chills, wheezing, hemoptysis, SOB at rest, LE swelling, recent travel/surgery/immobilization, personal/family hx of DVT/PE, abd pain, V/D/C, hematuria, dysuria, myalgias, arthralgias, claudication, orthopnea, numbness, tingling, focal weakness, or any other complaints at this time.    The history is provided by the patient and medical records. No language interpreter was used.  Chest Pain   This is a new problem. The current episode started 6 to 12 hours ago. The problem occurs constantly. The problem has not changed since onset.The pain is associated with rest. The pain is present in the substernal region. The pain is at a severity of 9/10. The pain is moderate. The quality of the pain is described as pressure-like and sharp. Radiates to: L chest. Duration of episode(s) is 6 hours. The symptoms are aggravated by certain positions and deep breathing. Associated symptoms include cough, nausea and shortness of breath (with exertion). Pertinent negatives include no abdominal pain, no claudication, no diaphoresis, no fever, no hemoptysis, no lower extremity edema, no numbness, no orthopnea, no vomiting and no weakness. She has tried nitroglycerin (and ASA 324mg ) for the symptoms. The treatment provided no relief. Risk factors include hormone replacement therapy.  Pertinent negatives for past medical history include no diabetes, no DVT, no hyperlipidemia, no hypertension and no PE.  Her family medical history is significant for CAD.  Pertinent negatives for family medical history include: no early MI and no PE.  Past Medical History:  Diagnosis Date  . Anxiety    takes Xanax as needed  . Arthritis   . Back pain    herniated disc  . Constipation    takes Colace daily as needed  . DDD (degenerative disc disease)   . Depression    takes Wellbutrin daily  . Depression with anxiety    r/t death of mother  .  Dizziness   . GERD (gastroesophageal reflux disease)    was taking Prilosec but has been off 2-3 wks  . Headache(784.0)    occasionally  . History of bronchitis yrs ago  . Insomnia    takes Xanax nightly as needed as well as Ambien  . Muscle spasm    takes Robaxin daily as needed  . Nocturia   . Shortness of breath    JUST WHEN I MOVE - RELATED TO MY BACK PAIN  . Ulcer   . Urinary frequency   . Vitamin D deficiency    takes Vit D weekly  . Weakness    numbness and tingling in right leg    Patient Active Problem List   Diagnosis Date Noted  . S/P lumbar spinal fusion 09/21/2015  . 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 HNP (herniated nucleus pulposus) 08/26/2013  . Low back pain 07/18/2013    Past Surgical History:  Procedure Laterality Date  . ABDOMINAL HYSTERECTOMY    . BACK SURGERY    . COLONOSCOPY    . COLONOSCOPY WITH PROPOFOL N/A 11/04/2014   Procedure: COLONOSCOPY WITH PROPOFOL;  Surgeon: Charolett Bumpers, MD;  Location: [facility] END[facility];  Service: Endoscopy;  Laterality: N/A;  . EXPLORATORY LAPAROTOMY    . LUMBAR DISC SURGERY  10/14/2013   revision L5-S1  . LUMBAR LAMINECTOMY/DECOMPRESSION MICRODISCECTOMY N/A 08/26/2013   Procedure: MICRO LUMBER DECOMPRESSION L5-S1 BILATERAL;  Surgeon: Javier Docker, MD;  Location: [facility] ORS;  Service: Orthopedics;  Laterality: N/A;  . LUMBAR LAMINECTOMY/DECOMPRESSION MICRODISCECTOMY Right 10/14/2013   Procedure: REVISION RIGHT L5-S1 DISCECTOMY ;  Surgeon: Venita Lick, MD;  Location: [facility] OR;  Service: Orthopedics;  Laterality: Right;  . MAXIMUM ACCESS (MAS)POSTERIOR LUMBAR INTERBODY FUSION (PLIF) 1 LEVEL N/A 09/21/2015   Procedure: L5/S1 MAXIMUM ACCESS (MAS) POSTERIOR LUMBAR INTERBODY FUSION (PLIF);  Surgeon: Tia Alert, MD;  Location: [facility] NEURO ORS;  Service: Neurosurgery;  Laterality: N/A;  . OVARIAN CYST SURGERY    . SHOULDER ARTHROSCOPY WITH ROTATOR CUFF REPAIR Right   . TUBAL LIGATION  1980's    OB History    No data available        Home Medications    Prior to Admission medications   Medication Sig Start Date End Date Taking? Authorizing Provider  amoxicillin-clavulanate (AUGMENTIN) 875-125 MG tablet Take 1 tablet by mouth 2 (two) times daily. 02/18/16   [name], MD  diazepam (VALIUM) 5 MG tablet TAKE 1 TABLET BY MOUTH EVERY 6 HOURS AS NEEDED FOR SPASMS 09/24/15   [provider]  estradiol (ESTRACE) 0.5 MG tablet Take 0.25-0.5 mg by mouth See admin instructions. 1/2 tab in the morning and 1 tab at night    [provider]  Halcinonide (HALOG) 0.1 % CREA Apply 1 application topically 3 (three) times daily as needed (for eczema).    [provider]  ipratropium (ATROVENT) 0.06 % nasal spray Place 2 sprays into both nostrils 4 (four) times daily. 02/18/16   [name], MD  psyllium (METAMUCIL) 58.6 % powder Take 1 packet by mouth daily.  [provider]  Vitamin D, Ergocalciferol, (DRISDOL) 50000 units CAPS capsule Take 50,000 Units by mouth 2 (two) times a week. 07/07/15   [provider]  zolpidem (AMBIEN) 10 MG tablet Take 10 mg by mouth at bedtime. 08/28/15   [provider]    Family History Family History  Problem Relation Age of Onset  . Transient ischemic attack Mother        multiple  . COPD Father     Social History Social History  Substance Use Topics  . Smoking status: Never Smoker  . Smokeless tobacco: Never Used  . Alcohol use No     Allergies   Nabumetone   Review of Systems Review of Systems  Constitutional: Negative for chills, diaphoresis and fever.  HENT: Positive for congestion.   Respiratory: Positive for cough and shortness of breath (with exertion). Negative for hemoptysis and wheezing.   Cardiovascular: Positive for chest pain. Negative for orthopnea, claudication and leg swelling.  Gastrointestinal: Positive for nausea. Negative for abdominal pain, constipation, diarrhea and vomiting.  Genitourinary: Negative  for dysuria and hematuria.  Musculoskeletal: Negative for arthralgias and myalgias.  Skin: Negative for color change.  Allergic/Immunologic: Negative for immunocompromised state.  Neurological: Positive for light-headedness. Negative for weakness and numbness.  Psychiatric/Behavioral: Negative for confusion.   All other systems reviewed and are negative for acute change except as noted in the HPI.     Physical Exam Updated Vital Signs BP (!) 115/57   Pulse 68   Temp 98.4 F (36.9 C) (Oral)   Resp 14   Ht  (1.651 m)   Wt 72.1 kg (159 lb)   SpO2 96%   BMI 26.46 kg/m   Physical Exam  Constitutional: She is oriented to person, place, and time. Vital signs are normal. She appears well-developed and well-nourished.  Non-toxic appearance. No distress.  Afebrile, nontoxic, NAD  HENT:  Head: Normocephalic and atraumatic.  Mouth/Throat: Oropharynx is clear and moist and mucous membranes are normal.  Eyes: Conjunctivae and EOM are normal. Right eye exhibits no discharge. Left eye exhibits no discharge.  Neck: Normal range of motion. Neck supple.  Cardiovascular: Normal rate, regular rhythm, normal heart sounds and intact distal pulses.  Exam reveals no gallop and no friction rub.   No murmur heard. RRR, nl s1/s2, no m/r/g, distal pulses intact, no pedal edema   Pulmonary/Chest: Effort normal and breath sounds normal. No respiratory distress. She has no decreased breath sounds. She has no wheezes. She has no rhonchi. She has no rales. She exhibits tenderness. She exhibits no crepitus, no deformity and no retraction.    CTAB in all lung fields, no w/r/r, no hypoxia or increased WOB, speaking in full sentences, SpO2 96% on RA Chest wall with mild TTP to lower sternal edge/epigastric area, without crepitus, deformities, or retractions   Abdominal: Soft. Normal appearance and bowel sounds are normal. She exhibits no distension. There is tenderness in the right upper quadrant and  epigastric area. There is positive Murphy's sign. There is no rigidity, no rebound, no guarding, no CVA tenderness and no tenderness at McBurney's point.    Soft, nondistended, +BS throughout, with moderate epigastric and RUQ TTP, no r/g/r, +murphy's, neg mcburney's, no CVA TTP   Musculoskeletal: Normal range of motion.  MAE x4 Strength and sensation grossly intact in all extremities Distal pulses intact No pedal edema, neg homan's bilaterally   Neurological: She is alert and oriented to person, place, and time. She has normal strength. No  sensory deficit.  Skin: Skin is warm, dry and intact. No rash noted.  Psychiatric: She has a normal mood and affect.  Nursing note and vitals reviewed.    ED Treatments / Results  Labs (all labs ordered are listed, but only abnormal results are displayed) Labs Reviewed  URINALYSIS, ROUTINE W REFLEX MICROSCOPIC - Abnormal; Notable for the following:       Result Value   Color, Urine STRAW (*)    Specific Gravity, Urine 1.003 (*)    All other components within normal limits  HEPATIC FUNCTION PANEL - Abnormal; Notable for the following:    Bilirubin, Direct <0.1 (*)    All other components within normal limits  D-DIMER, QUANTITATIVE (NOT AT [facility]) - Abnormal; Notable for the following:    D-Dimer, Quant 0.58 (*)    All other components within normal limits  BASIC METABOLIC PANEL  CBC  LIPASE, BLOOD  I-STAT TROPONIN, ED  I-STAT TROPONIN, ED    EKG  EKG Interpretation  Date/Time:  Friday November 30 2016 12:39:30 EDT Ventricular Rate:  70 PR Interval:    QRS Duration: 102 QT Interval:  413 QTC Calculation: 446 R Axis:   82 Text Interpretation:  Sinus rhythm No significant change was found Confirmed by Azalia Bilis ([id number]) on 11/30/2016 12:56:49 PM       Radiology Dg Chest 2 View  Result Date: 11/30/2016 CLINICAL DATA:  Chest pain, blurred vision, and headache beginning today. EXAM: CHEST  2 VIEW COMPARISON:  02/18/2016 FINDINGS:  The heart size and mediastinal contours are within normal limits. Both lungs are clear. The visualized skeletal structures are unremarkable. IMPRESSION: Negative.  No active cardiopulmonary disease. Electronically Signed   By: Myles Rosenthal M.D.   On: 11/30/2016 14:43   US Abdomen Complete  Result Date: 11/30/2016 CLINICAL DATA:  Epigastric and right upper quadrant abdominal pain and nausea since 7:30 a.m. today. Positive Murphy's sign. EXAM: ABDOMEN ULTRASOUND COMPLETE COMPARISON:  Abdomen and pelvis CT dated 11/11/2014. FINDINGS: Gallbladder: Multiple gallstones in the gallbladder. The largest measures 1.2 cm in maximum diameter. No gallbladder wall thickening or pericholecystic fluid. The patient has received pain medication, making it difficult to assess for a sonographic Murphy's sign. Common bile duct: Diameter: 3.2 mm Liver: Mildly echogenic. Portal vein is patent on color Doppler imaging with normal direction of blood flow towards the liver. IVC: No abnormality visualized. Pancreas: Not visualized due to overlying bowel gas. Spleen: Size and appearance within normal limits. Right Kidney: Length: 11.0 cm. Echogenicity within normal limits. No mass or hydronephrosis visualized. Left Kidney: Length: 11.3 cm. Echogenicity within normal limits. 8 mm oval brightly echogenic focus in the mid left kidney cortex, not seen on the previous CT. No mass or hydronephrosis visualized. Abdominal aorta: No aneurysm visualized. Other findings: None. IMPRESSION: 1. Cholelithiasis without evidence of acute cholecystitis. 2. Interval 8 mm probable mid left renal calculus, possibly in a calyx. 3. Mildly echogenic liver, most likely due to steatosis. 4. Electronically Signed   By: Beckie Salts M.D.   On: 11/30/2016 15:26    Procedures Procedures (including critical care time)  Medications Ordered in ED Medications  sodium chloride 0.9 % bolus 1,000 mL (0 mLs Intravenous Stopped 11/30/16 1615)  gi cocktail  (Maalox,Lidocaine,Donnatal) (30 mLs Oral Given 11/30/16 1418)  famotidine (PEPCID) IVPB 20 mg premix (0 mg Intravenous Stopped 11/30/16 1615)  morphine 4 MG/ML injection 4 mg (4 mg Intravenous Given 11/30/16 1419)  morphine 4 MG/ML injection 4 mg (4 mg Intravenous Given 11/30/16  1616)  HYDROmorphone (DILAUDID) injection 0.5 mg (0.5 mg Intravenous Given 11/30/16 1733)  promethazine (PHENERGAN) injection 25 mg (25 mg Intravenous Given 11/30/16 1735)     Initial Impression / Assessment and Plan / ED Course  I have reviewed the triage vital signs and the nursing notes.  Pertinent labs & imaging results that were available during my care of the patient were reviewed by me and considered in my medical decision making (see chart for details).     56 y.o. female here with sudden onset CP that began at 7:30am when she woke up from rest. Associated lightheadedness, nausea, and SOB with exertion. Has had a cough and sinus congestion x1 wk as well. On estrogen replacement therapy, but otherwise no RFs for PE/DVT. On exam, clear lungs, no hypoxia or tachycardia, moderate epigastric and RUQ TTP, +murphy's sign, no LE swelling. Work up thus far: EKG unremarkable, CBC WNL, trop neg (5.5hrs since onset). Awaiting U/A and BMP. Most suspicious etiology is actually gallbladder vs gastritis/PUD/GI etiology, given exam findings; however, given that she's on estrogen and has pleuritic CP, PE still on the list of possibilities. Will add-on LFTs, lipase, D-dimer, and abd U/S, and await BMP, U/A, and CXR. Will give morphine, pepcid, GI cocktail, and fluids; zofran given en route which helped, ASA and NTG didn't help, doubt need to repeat NTG at this time. Will reassess shortly.   3:55 PM BMP WNL. U/A essentially unremarkable. CXR negative. LFTs WNL. Lipase WNL. Abd U/S showing multiple gallstones in the gallbladder, largest diameter 1.2cm, but no gallbladder wall thickening or pericholecystic fluid; also incidentally has mid  left renal calculus and hepatic steatosis. D-dimer 0.58 which is just barely over the cut off, however adjusted for age this is a negative result; given findings of cholelithiasis on U/S and exam more consistent with GI etiology, less likely PE, doubt need for CTA at this point. Will repeat troponin since it's been ~8hrs since onset. Pt feeling better initially, but after U/S pain has increased again; will repeat morphine dose and reassess shortly. Discussed case with my attending Dr. Patria Mane who agrees with plan.  5:25 PM Second troponin at nearly 9 hrs since onset negative. Pt's pain still 9/10 despite second dose of morphine, and nausea now returning; will give dilaudid and phenergan; if unable to control symptoms then will need to consider admission for symptomatic cholelithiasis with intractable symptoms. Will see how she does after these meds, and reassess; will need surgery consult if uncontrolled symptoms.   6:28 PM Pt still having some pain and slight nausea despite dilaudid and phenergan. Given intractable symptoms, consulted general surgery and spoke with Dr. Corliss Skains who agreed that she likely has symptomatic cholelithiasis; keep NPO for now, and he will come see pt and admit. Holding orders to be placed by admitting team. Please see their notes for further documentation of care. I appreciate their help with this pleasant pt's care. Pt stable at time of admission.   Final Clinical Impressions(s) / ED Diagnoses   Final diagnoses:  Symptomatic cholelithiasis  Precordial chest pain  Nausea  SOB (shortness of breath) on exertion  Intractable pain    New Prescriptions New Prescriptions   No medications on [address], [place], [place] 11/30/16 1831    Azalia Bilis, MD 12/01/16 [phone]

## 2016-11-30 NOTE — ED Notes (Signed)
Patient transported to X-ray 

## 2016-12-01 ENCOUNTER — Observation Stay (HOSPITAL_COMMUNITY): Payer: BLUE CROSS/BLUE SHIELD | Admitting: Certified Registered Nurse Anesthetist

## 2016-12-01 ENCOUNTER — Encounter (HOSPITAL_COMMUNITY): Payer: Self-pay | Admitting: Certified Registered Nurse Anesthetist

## 2016-12-01 ENCOUNTER — Encounter (HOSPITAL_COMMUNITY)
Admission: EM | Disposition: A | Discharge status: Home / Self Care | Payer: Self-pay | Source: Home / Self Care | Attending: Emergency Medicine

## 2016-12-01 DIAGNOSIS — M1990 Unspecified osteoarthritis, unspecified site: Secondary | ICD-10-CM | POA: Diagnosis not present

## 2016-12-01 DIAGNOSIS — K819 Cholecystitis, unspecified: Secondary | ICD-10-CM | POA: Diagnosis not present

## 2016-12-01 DIAGNOSIS — K824 Cholesterolosis of gallbladder: Secondary | ICD-10-CM | POA: Diagnosis not present

## 2016-12-01 DIAGNOSIS — K8010 Calculus of gallbladder with chronic cholecystitis without obstruction: Secondary | ICD-10-CM | POA: Diagnosis not present

## 2016-12-01 DIAGNOSIS — M545 Low back pain: Secondary | ICD-10-CM | POA: Diagnosis not present

## 2016-12-01 HISTORY — PX: CHOLECYSTECTOMY: SHX55

## 2016-12-01 LAB — SURGICAL PCR SCREEN
MRSA, PCR: NEGATIVE
Staphylococcus aureus: POSITIVE — AB

## 2016-12-01 SURGERY — LAPAROSCOPIC CHOLECYSTECTOMY
Anesthesia: General | Site: Abdomen

## 2016-12-01 MED ORDER — LACTATED RINGERS IV SOLN
INTRAVENOUS | Status: DC
  Administered 2016-12-01 (×2): via INTRAVENOUS

## 2016-12-01 MED ORDER — ONDANSETRON HCL 4 MG/2ML IJ SOLN
INTRAMUSCULAR | Status: DC | PRN
  Administered 2016-12-01 (×2): 4 mg via INTRAVENOUS

## 2016-12-01 MED ORDER — OXYCODONE HCL 5 MG PO TABS
5.0000 mg | ORAL_TABLET | ORAL | Status: DC | PRN
  Administered 2016-12-01 – 2016-12-02 (×5): 10 mg via ORAL
  Filled 2016-12-01 (×5): qty 2

## 2016-12-01 MED ORDER — 0.9 % SODIUM CHLORIDE (POUR BTL) OPTIME
TOPICAL | Status: DC | PRN
  Administered 2016-12-01: 1000 mL

## 2016-12-01 MED ORDER — CEFAZOLIN SODIUM-DEXTROSE 2-4 GM/100ML-% IV SOLN
INTRAVENOUS | Status: AC
  Filled 2016-12-01: qty 100

## 2016-12-01 MED ORDER — PROPOFOL 10 MG/ML IV BOLUS
INTRAVENOUS | Status: AC
  Filled 2016-12-01: qty 20

## 2016-12-01 MED ORDER — SODIUM CHLORIDE 0.9 % IR SOLN
Status: DC | PRN
  Administered 2016-12-01: 1000 mL

## 2016-12-01 MED ORDER — SUGAMMADEX SODIUM 200 MG/2ML IV SOLN
INTRAVENOUS | Status: DC | PRN
  Administered 2016-12-01: 300 mg via INTRAVENOUS

## 2016-12-01 MED ORDER — BUPIVACAINE-EPINEPHRINE 0.25% -1:200000 IJ SOLN
INTRAMUSCULAR | Status: DC | PRN
  Administered 2016-12-01: 10 mL

## 2016-12-01 MED ORDER — FENTANYL CITRATE (PF) 100 MCG/2ML IJ SOLN
INTRAMUSCULAR | Status: DC | PRN
  Administered 2016-12-01 (×2): 100 ug via INTRAVENOUS
  Administered 2016-12-01: 50 ug via INTRAVENOUS
  Administered 2016-12-01: 100 ug via INTRAVENOUS

## 2016-12-01 MED ORDER — LIDOCAINE HCL (CARDIAC) 20 MG/ML IV SOLN
INTRAVENOUS | Status: DC | PRN
  Administered 2016-12-01: 60 mg via INTRAVENOUS

## 2016-12-01 MED ORDER — ROCURONIUM BROMIDE 100 MG/10ML IV SOLN
INTRAVENOUS | Status: DC | PRN
  Administered 2016-12-01: 40 mg via INTRAVENOUS

## 2016-12-01 MED ORDER — FENTANYL CITRATE (PF) 250 MCG/5ML IJ SOLN
INTRAMUSCULAR | Status: AC
  Filled 2016-12-01: qty 5

## 2016-12-01 MED ORDER — FENTANYL CITRATE (PF) 100 MCG/2ML IJ SOLN
INTRAMUSCULAR | Status: AC
  Filled 2016-12-01: qty 2

## 2016-12-01 MED ORDER — CHLORHEXIDINE GLUCONATE CLOTH 2 % EX PADS
6.0000 | MEDICATED_PAD | Freq: Every day | CUTANEOUS | Status: DC
  Administered 2016-12-01 – 2016-12-02 (×2): 6 via TOPICAL

## 2016-12-01 MED ORDER — MUPIROCIN 2 % EX OINT
1.0000 "application " | TOPICAL_OINTMENT | Freq: Two times a day (BID) | CUTANEOUS | Status: DC
  Administered 2016-12-01 – 2016-12-02 (×3): 1 via NASAL
  Filled 2016-12-01 (×2): qty 22

## 2016-12-01 MED ORDER — IOPAMIDOL (ISOVUE-300) INJECTION 61%
INTRAVENOUS | Status: AC
  Filled 2016-12-01: qty 50

## 2016-12-01 MED ORDER — FENTANYL CITRATE (PF) 100 MCG/2ML IJ SOLN
25.0000 ug | INTRAMUSCULAR | Status: DC | PRN
  Administered 2016-12-01 (×4): 50 ug via INTRAVENOUS

## 2016-12-01 MED ORDER — FENTANYL CITRATE (PF) 250 MCG/5ML IJ SOLN
INTRAMUSCULAR | Status: AC
Start: 2016-12-01 — End: ?
  Filled 2016-12-01: qty 5

## 2016-12-01 MED ORDER — DEXAMETHASONE SODIUM PHOSPHATE 4 MG/ML IJ SOLN
INTRAMUSCULAR | Status: DC | PRN
  Administered 2016-12-01: 10 mg via INTRAVENOUS

## 2016-12-01 MED ORDER — PROPOFOL 10 MG/ML IV BOLUS
INTRAVENOUS | Status: DC | PRN
  Administered 2016-12-01: 150 mg via INTRAVENOUS

## 2016-12-01 MED ORDER — MIDAZOLAM HCL 5 MG/5ML IJ SOLN
INTRAMUSCULAR | Status: DC | PRN
  Administered 2016-12-01: 2 mg via INTRAVENOUS

## 2016-12-01 MED ORDER — BUPIVACAINE-EPINEPHRINE (PF) 0.25% -1:200000 IJ SOLN
INTRAMUSCULAR | Status: AC
  Filled 2016-12-01: qty 30

## 2016-12-01 MED ORDER — MIDAZOLAM HCL 2 MG/2ML IJ SOLN
INTRAMUSCULAR | Status: AC
  Filled 2016-12-01: qty 2

## 2016-12-01 MED ORDER — PROMETHAZINE HCL 25 MG/ML IJ SOLN: 6.2500 mg | INTRAMUSCULAR | Status: DC | PRN

## 2016-12-01 SURGICAL SUPPLY — 41 items
APPLIER CLIP 5 13 M/L LIGAMAX5 (MISCELLANEOUS) ×2
BLADE CLIPPER SURG (BLADE) IMPLANT
CANISTER SUCT 3000ML PPV (MISCELLANEOUS) ×2 IMPLANT
CHLORAPREP W/TINT 26ML (MISCELLANEOUS) ×2 IMPLANT
CLIP APPLIE 5 13 M/L LIGAMAX5 (MISCELLANEOUS) ×1 IMPLANT
COVER SURGICAL LIGHT HANDLE (MISCELLANEOUS) ×2 IMPLANT
DERMABOND ADHESIVE PROPEN (GAUZE/BANDAGES/DRESSINGS) ×1
DERMABOND ADVANCED (GAUZE/BANDAGES/DRESSINGS) ×1
DERMABOND ADVANCED .7 DNX12 (GAUZE/BANDAGES/DRESSINGS) ×1 IMPLANT
DERMABOND ADVANCED .7 DNX6 (GAUZE/BANDAGES/DRESSINGS) ×1 IMPLANT
ELECT REM PT RETURN 9FT ADLT (ELECTROSURGICAL) ×2
ELECTRODE REM PT RTRN 9FT ADLT (ELECTROSURGICAL) ×1 IMPLANT
FILTER SMOKE EVAC LAPAROSHD (FILTER) IMPLANT
GLOVE BIO SURGEON STRL SZ8 (GLOVE) ×2 IMPLANT
GLOVE BIOGEL PI IND STRL 8 (GLOVE) ×1 IMPLANT
GLOVE BIOGEL PI INDICATOR 8 (GLOVE) ×1
GOWN STRL REUS W/ TWL LRG LVL3 (GOWN DISPOSABLE) ×2 IMPLANT
GOWN STRL REUS W/ TWL XL LVL3 (GOWN DISPOSABLE) ×1 IMPLANT
GOWN STRL REUS W/TWL LRG LVL3 (GOWN DISPOSABLE) ×2
GOWN STRL REUS W/TWL XL LVL3 (GOWN DISPOSABLE) ×1
KIT BASIN OR (CUSTOM PROCEDURE TRAY) ×2 IMPLANT
KIT ROOM TURNOVER OR (KITS) ×2 IMPLANT
L-HOOK LAP DISP 36CM (ELECTROSURGICAL) ×2
LHOOK LAP DISP 36CM (ELECTROSURGICAL) ×1 IMPLANT
NEEDLE 22X1 1/2 (OR ONLY) (NEEDLE) ×2 IMPLANT
NS IRRIG 1000ML POUR BTL (IV SOLUTION) ×2 IMPLANT
PAD ARMBOARD 7.5X6 YLW CONV (MISCELLANEOUS) ×2 IMPLANT
PENCIL BUTTON HOLSTER BLD 10FT (ELECTRODE) ×2 IMPLANT
POUCH RETRIEVAL ECOSAC 10 (ENDOMECHANICALS) ×1 IMPLANT
POUCH RETRIEVAL ECOSAC 10MM (ENDOMECHANICALS) ×1
SCISSORS LAP 5X35 DISP (ENDOMECHANICALS) ×2 IMPLANT
SET IRRIG TUBING LAPAROSCOPIC (IRRIGATION / IRRIGATOR) ×2 IMPLANT
SLEEVE ENDOPATH XCEL 5M (ENDOMECHANICALS) ×4 IMPLANT
SPECIMEN JAR SMALL (MISCELLANEOUS) ×2 IMPLANT
SUT VIC AB 4-0 PS2 27 (SUTURE) ×2 IMPLANT
TOWEL OR 17X24 6PK STRL BLUE (TOWEL DISPOSABLE) ×2 IMPLANT
TOWEL OR 17X26 10 PK STRL BLUE (TOWEL DISPOSABLE) ×2 IMPLANT
TRAY LAPAROSCOPIC MC (CUSTOM PROCEDURE TRAY) ×2 IMPLANT
TROCAR XCEL BLUNT TIP 100MML (ENDOMECHANICALS) ×2 IMPLANT
TROCAR XCEL NON-BLD 5MMX100MML (ENDOMECHANICALS) ×2 IMPLANT
TUBING INSUFFLATION (TUBING) ×2 IMPLANT

## 2016-12-01 NOTE — Anesthesia Procedure Notes (Signed)
Procedure Name: Intubation Date/Time: 12/01/2016 10:54 AM Performed by: Reine Just Pre-anesthesia Checklist: Patient identified, Emergency Drugs available, Suction available and Patient being monitored Patient Re-evaluated:Patient Re-evaluated prior to induction Oxygen Delivery Method: Circle system utilized Preoxygenation: Pre-oxygenation with 100% oxygen Induction Type: IV induction Ventilation: Mask ventilation without difficulty Laryngoscope Size: Miller and 2 Grade View: Grade I Tube type: Oral Tube size: 7.5 mm Number of attempts: 1 Airway Equipment and Method: Patient positioned with wedge pillow and Stylet Placement Confirmation: ETT inserted through vocal cords under direct vision,  positive ETCO2 and breath sounds checked- equal and bilateral Secured at: 21 cm Tube secured with: Tape Dental Injury: Teeth and Oropharynx as per pre-operative assessment

## 2016-12-01 NOTE — Op Note (Signed)
11/30/2016 - 12/01/2016  11:40 AM  PATIENT:  [name]  56 y.o. female  PRE-OPERATIVE DIAGNOSIS:  Cholecystitis with cholelithiasis  POST-OPERATIVE DIAGNOSIS:  Cholecystitis with cholelithiasis  PROCEDURE:  Procedure(s): LAPAROSCOPIC CHOLECYSTECTOMY  SURGEON:  Surgeon(s): Violeta Gelinas, MD  ASSISTANTS: none   ANESTHESIA:   local and general  EBL:  Total I/O In: 0  Out: 25 [Blood:25]  BLOOD ADMINISTERED:none  DRAINS: none   SPECIMEN:  Excision  DISPOSITION OF SPECIMEN:  PATHOLOGY  COUNTS:  YES  DICTATION: .Dragon Dictation Procedure in detail: Keniya presents for cholecystectomy. She was identified in the preop holding area. She received intravenous and buttocks. Informed consent was obtained. She was brought to the operating room and general endotracheal anesthesia was a Optician, dispensing by the anesthesia staff. Her abdomen was prepped and draped in a sterile fashion. Time out procedure was performed.The infraumbilical region was infiltrated with local. Infraumbilical incision was made. Subcutaneous tissues were dissected down revealing the anterior fascia. This was divided sharply along the midline. Peritoneal cavity was entered under direct vision without complication. A 0 Vicryl pursestring was placed around the fascial opening. Hassan trocar was inserted into the abdomen. The abdomen was insufflated with carbon dioxide in standard fashion. Under direct vision a 5 mm epigastric and 2 5 mm right abdominal ports were placed. Local was used at each port site. The dome the gallbladder was retracted superior medially. It was acutely inflamed. The infundibulum was retracted inferior laterally. Dissection began laterally and progressed medially identifying the cystic duct and anterior branch of the cystic artery. Cystic duct was dissected until a critical view of safety was achieved. The cystic duct was too short to do a calendula. 3 clips were placed proximally on it, one was placed  distally and it was divided. the cystic artery was clipped twice proximally and divided. The gallbladder was taken off the liver bed. Cautery was used to get good hemostasis. The gallbladder was placed in a bag and removed from the abdomen. It was sent to pathology. Liver bed was irrigated. It was dry. Clips remain in good position. Pneumoperitoneum was released. Ports removed under direct vision. Infraumbilical fascia was closed by tying the pursestring. All 4 wounds were irrigated and closed with 4-0 Vicryl followed by Dermabond. All counts were correct. She tolerated procedure well without apparent complications and was taken recovery in stable condition.       PATIENT DISPOSITION:  PACU - hemodynamically stable.   Delay start of Pharmacological VTE agent (>24hrs) due to surgical blood loss or risk of bleeding:  no  Violeta Gelinas, MD, MPH, FACS Pager: [phone]  9/22/201811:40 AM

## 2016-12-01 NOTE — OR Nursing (Signed)
Phone call to Dr. Sampson Goon, orders received may give additional 50 mcg Fentanyl for total of 200 mcg in PACU.

## 2016-12-01 NOTE — Anesthesia Preprocedure Evaluation (Signed)
Anesthesia Evaluation  Patient identified by MRN, date of birth, ID band Patient awake    Reviewed: Allergy & Precautions, NPO status , Patient's Chart, lab work & pertinent test results  Airway Mallampati: II  TM Distance: >3 FB Neck ROM: Full    Dental  (+) Dental Advisory Given   Pulmonary neg pulmonary ROS,    breath sounds clear to auscultation       Cardiovascular negative cardio ROS   Rhythm:Regular Rate:Normal     Neuro/Psych Anxiety Depression negative neurological ROS     GI/Hepatic negative GI ROS, Neg liver ROS,   Endo/Other  negative endocrine ROS  Renal/GU negative Renal ROS     Musculoskeletal  (+) Arthritis ,   Abdominal   Peds  Hematology negative hematology ROS (+)   Anesthesia Other Findings   Reproductive/Obstetrics                             Lab Results  Component Value Date   WBC 6.4 11/30/2016   HGB 13.2 11/30/2016   HCT 39.6 11/30/2016   MCV 90.8 11/30/2016   PLT 238 11/30/2016   Lab Results  Component Value Date   CREATININE 0.62 11/30/2016   BUN 17 11/30/2016   NA 141 11/30/2016   K 4.0 11/30/2016   CL 107 11/30/2016   CO2 25 11/30/2016    Anesthesia Physical Anesthesia Plan  ASA: I  Anesthesia Plan: General   Post-op Pain Management:    Induction: Intravenous  PONV Risk Score and Plan: 4 or greater and Ondansetron, Dexamethasone and Treatment may vary due to age or medical condition  Airway Management Planned: Oral ETT  Additional Equipment:   Intra-op Plan:   Post-operative Plan: Extubation in OR  Informed Consent: I have reviewed the patients History and Physical, chart, labs and discussed the procedure including the risks, benefits and alternatives for the proposed anesthesia with the patient or authorized representative who has indicated his/her understanding and acceptance.   Dental advisory given  Plan Discussed with:  CRNA  Anesthesia Plan Comments:         Anesthesia Quick Evaluation

## 2016-12-01 NOTE — Transfer of Care (Signed)
Immediate Anesthesia Transfer of Care Note  Patient: [name]  Procedure(s) Performed: Procedure(s): LAPAROSCOPIC CHOLECYSTECTOMY (N/A)  Patient Location: PACU  Anesthesia Type:General  Level of Consciousness: awake, alert  and oriented  Airway & Oxygen Therapy: Patient Spontanous Breathing and Patient connected to nasal cannula oxygen  Post-op Assessment: Report given to RN, Post -op Vital signs reviewed and stable and Patient moving all extremities  Post vital signs: Reviewed and stable  Last Vitals:  Vitals:   12/01/16 0849 12/01/16 1145  BP: 111/62   Pulse: (!) 58   Resp: 14   Temp: 36.5 C 36.6 C  SpO2: 99%     Last Pain:  Vitals:   12/01/16 0849  TempSrc: Oral  PainSc:          Complications: No apparent anesthesia complications

## 2016-12-01 NOTE — Progress Notes (Signed)
Day of Surgery   Subjective/Chief Complaint: RUQ pain   Objective: Vital signs in last 24 hours: Temp:  [97.5 F (36.4 C)-98.4 F (36.9 C)] 97.7 F (36.5 C) (09/22 0849) Pulse Rate:  [58-78] 58 (09/22 0849) Resp:  [10-25] 14 (09/22 0849) BP: (94-124)/(46-80) 111/62 (09/22 0849) SpO2:  [94 %-99 %] 99 % (09/22 0849) Weight:  [72.1 kg (159 lb)-76.1 kg (167 lb 12.8 oz)] 76.1 kg (167 lb 12.8 oz) (09/21 2221) Last BM Date: 11/30/16  Intake/Output from previous day: 09/21 0701 - 09/22 0700 In: 1665 [P.O.:240; I.V.:375; IV Piggyback:1050] Out: 1200 [Urine:1200] Intake/Output this shift: No intake/output data recorded.  General appearance: cooperative Resp: clear to auscultation bilaterally Cardio: regular rate and rhythm, S1, S2 normal, no murmur, click, rub or gallop GI: soft, tender RUQ  Lab Results:   Recent Labs  11/30/16 1245  WBC 6.4  HGB 13.2  HCT 39.6  PLT 238   BMET  Recent Labs  11/30/16 1245  NA 141  K 4.0  CL 107  CO2 25  GLUCOSE 91  BUN 17  CREATININE 0.62  CALCIUM 9.1   PT/INR No results for input(s): LABPROT, INR in the last 72 hours. ABG No results for input(s): PHART, HCO3 in the last 72 hours.  Invalid input(s): PCO2, PO2  Studies/Results: Dg Chest 2 View  Result Date: 11/30/2016 CLINICAL DATA:  Chest pain, blurred vision, and headache beginning today. EXAM: CHEST  2 VIEW COMPARISON:  02/18/2016 FINDINGS: The heart size and mediastinal contours are within normal limits. Both lungs are clear. The visualized skeletal structures are unremarkable. IMPRESSION: Negative.  No active cardiopulmonary disease. Electronically Signed   By: Myles Rosenthal M.D.   On: 11/30/2016 14:43   US Abdomen Complete  Result Date: 11/30/2016 CLINICAL DATA:  Epigastric and right upper quadrant abdominal pain and nausea since 7:30 a.m. today. Positive Murphy's sign. EXAM: ABDOMEN ULTRASOUND COMPLETE COMPARISON:  Abdomen and pelvis CT dated 11/11/2014. FINDINGS:  Gallbladder: Multiple gallstones in the gallbladder. The largest measures 1.2 cm in maximum diameter. No gallbladder wall thickening or pericholecystic fluid. The patient has received pain medication, making it difficult to assess for a sonographic Murphy's sign. Common bile duct: Diameter: 3.2 mm Liver: Mildly echogenic. Portal vein is patent on color Doppler imaging with normal direction of blood flow towards the liver. IVC: No abnormality visualized. Pancreas: Not visualized due to overlying bowel gas. Spleen: Size and appearance within normal limits. Right Kidney: Length: 11.0 cm. Echogenicity within normal limits. No mass or hydronephrosis visualized. Left Kidney: Length: 11.3 cm. Echogenicity within normal limits. 8 mm oval brightly echogenic focus in the mid left kidney cortex, not seen on the previous CT. No mass or hydronephrosis visualized. Abdominal aorta: No aneurysm visualized. Other findings: None. IMPRESSION: 1. Cholelithiasis without evidence of acute cholecystitis. 2. Interval 8 mm probable mid left renal calculus, possibly in a calyx. 3. Mildly echogenic liver, most likely due to steatosis. 4. Electronically Signed   By: Beckie Salts M.D.   On: 11/30/2016 15:26    Anti-infectives: Anti-infectives    Start     Dose/Rate Route Frequency Ordered Stop   11/30/16 2300  [MAR Hold]  cefTRIAXone (ROCEPHIN) 2 g in dextrose 5 % 50 mL IVPB     (MAR Hold since 12/01/16 0925)   2 g 100 mL/hr over 30 Minutes Intravenous Every 24 hours 11/30/16 2219        Assessment/Plan: Cholecystitis - for lap chole/IOC this AM. Procedure, risks,and benefits discussed. I also discussed the  expected post-op course and she agrees.  LOS: 0 days    Jessica Checketts E 12/01/2016

## 2016-12-01 NOTE — Anesthesia Postprocedure Evaluation (Signed)
Anesthesia Post Note  Patient: [name]  Procedure(s) Performed: Procedure(s) (LRB): LAPAROSCOPIC CHOLECYSTECTOMY (N/A)     Patient location during evaluation: PACU Anesthesia Type: General Level of consciousness: awake and alert Pain management: pain level controlled Vital Signs Assessment: post-procedure vital signs reviewed and stable Respiratory status: spontaneous breathing, nonlabored ventilation, respiratory function stable and patient connected to nasal cannula oxygen Cardiovascular status: blood pressure returned to baseline and stable Postop Assessment: no apparent nausea or vomiting Anesthetic complications: no    Last Vitals:  Vitals:   12/01/16 1215 12/01/16 1315  BP: 140/68 139/68  Pulse: 69 67  Resp: 16 15  Temp:  36.6 C  SpO2: 100% 100%    Last Pain:  Vitals:   12/01/16 1315  TempSrc:   PainSc: 7                  Kennieth Rad

## 2016-12-02 ENCOUNTER — Encounter (HOSPITAL_COMMUNITY): Payer: Self-pay | Admitting: General Surgery

## 2016-12-02 MED ORDER — OXYCODONE HCL 5 MG PO TABS: 5.0000 mg | ORAL_TABLET | ORAL | 0 refills | Status: AC | PRN

## 2016-12-02 MED ORDER — PANTOPRAZOLE SODIUM 40 MG PO TBEC
40.0000 mg | DELAYED_RELEASE_TABLET | Freq: Every day | ORAL | Status: DC
Start: 2016-12-02 — End: 2016-12-02

## 2016-12-02 NOTE — Discharge Instructions (Signed)

## 2016-12-02 NOTE — Progress Notes (Signed)
Pt discharged to home accompanied by husband.  Rx for oxy given and explained.  DC instructions given and reviewed.  No questions about diet or post operative education.

## 2016-12-02 NOTE — Discharge Summary (Signed)
[facility] Discharge Summary   Patient ID: [name] MRN: [id number] DOB/AGE: 1961-02-14 56 y.o.  Admit date: 11/30/2016 Discharge date: 12/02/2016 Discharge Diagnosis Patient Active Problem List   Diagnosis Date Noted  . Symptomatic cholelithiasis 11/30/2016  . S/P lumbar spinal fusion 09/21/2015  . 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 HNP (herniated nucleus pulposus) 08/26/2013  . Low back pain 07/18/2013   Imaging: Dg Chest 2 View  Result Date: 11/30/2016 CLINICAL DATA:  Chest pain, blurred vision, and headache beginning today. EXAM: CHEST  2 VIEW COMPARISON:  02/18/2016 FINDINGS: The heart size and mediastinal contours are within normal limits. Both lungs are clear. The visualized skeletal structures are unremarkable. IMPRESSION: Negative.  No active cardiopulmonary disease. Electronically Signed   By: Myles Rosenthal M.D.   On: 11/30/2016 14:43   US Abdomen Complete  Result Date: 11/30/2016 CLINICAL DATA:  Epigastric and right upper quadrant abdominal pain and nausea since 7:30 a.m. today. Positive Murphy's sign. EXAM: ABDOMEN ULTRASOUND COMPLETE COMPARISON:  Abdomen and pelvis CT dated 11/11/2014. FINDINGS: Gallbladder: Multiple gallstones in the gallbladder. The largest measures 1.2 cm in maximum diameter. No gallbladder wall thickening or pericholecystic fluid. The patient has received pain medication, making it difficult to assess for a sonographic Murphy's sign. Common bile duct: Diameter: 3.2 mm Liver: Mildly echogenic. Portal vein is patent on color Doppler imaging with normal direction of blood flow towards the liver. IVC: No abnormality visualized. Pancreas: Not visualized due to overlying bowel gas. Spleen: Size and appearance within normal limits. Right Kidney: Length: 11.0 cm. Echogenicity within normal limits. No mass or hydronephrosis visualized. Left Kidney: Length: 11.3 cm. Echogenicity within normal limits. 8 mm oval brightly echogenic focus in the mid left kidney  cortex, not seen on the previous CT. No mass or hydronephrosis visualized. Abdominal aorta: No aneurysm visualized. Other findings: None. IMPRESSION: 1. Cholelithiasis without evidence of acute cholecystitis. 2. Interval 8 mm probable mid left renal calculus, possibly in a calyx. 3. Mildly echogenic liver, most likely due to steatosis. 4. Electronically Signed   By: Beckie Salts M.D.   On: 11/30/2016 15:26    Procedures Dr. Violeta Gelinas (12/01/16) - Laparoscopic Cholecystectomy  Hospital Course:  56 y.o. female with a PMHx of anxiety, GERD, headaches, DDD/chronic back pain, chronic SOB related to back pain, paresthesias/sciatica, and other medical conditions who presented to [facility] with less than 24 hours of epigastric/chest pain.  Workup showed symptomatic cholelithiasis.  Patient was admitted and underwent procedure listed above.  Tolerated procedure well and was transferred to the floor.  Diet was advanced as tolerated.  On POD#1, the patient was voiding well, tolerating diet, ambulating well, pain well controlled, vital signs stable, incisions c/d/i and felt stable for discharge home.  Patient will follow up in our office in 2 weeks and knows to call with questions or concerns.  He/She will call to confirm appointment date/time.    Physical Exam: General:  Alert, NAD, pleasant, comfortable Abd:  Soft, appropriately tender, ND, mild tenderness, incisions C/D/I  Allergies as of 12/02/2016      Reactions   Nabumetone Diarrhea      Medication List    TAKE these medications   acetaminophen 500 MG tablet Commonly known as:  TYLENOL Take 500-1,000 mg by mouth every 6 (six) hours as needed for headache (pain).   alprazolam 2 MG tablet Commonly known as:  XANAX Take 2 mg by mouth daily as needed for anxiety.   cholecalciferol 1000 units tablet Commonly known as:  VITAMIN  D Take 5,000 Units by mouth daily.   estradiol 0.5 MG tablet Commonly known as:  ESTRACE Take 0.5 mg by mouth at  bedtime.   ipratropium 0.06 % nasal spray Commonly known as:  ATROVENT Place 2 sprays into both nostrils 4 (four) times daily.   multivitamin with minerals Tabs tablet Take 2 tablets by mouth daily.   OVER THE COUNTER MEDICATION Apply 1 application topically 2 (two) times daily as needed (eczema). Over the counter (mail order) eczema cream   OVER THE COUNTER MEDICATION Take 3 capsules by mouth daily. Omega Advanced - OTC   OVER THE COUNTER MEDICATION Take 30 mLs by mouth daily. Global Blend (juice blend)   OVER THE COUNTER MEDICATION Take 1 tablet by mouth daily. "Zing" OTC   OVER THE COUNTER MEDICATION Take 2 tablets by mouth daily with lunch. "Cheat" appetite supressant - OTC   oxyCODONE 5 MG immediate release tablet Commonly known as:  Oxy IR/ROXICODONE Take 1 tablet (5 mg total) by mouth every 4 (four) hours as needed for moderate pain or severe pain.   PROBIOTIC PO Take by mouth See admin instructions. Take small amount of powdered probiotic by mouth daily at bedtime   zolpidem 10 MG tablet Commonly known as:  AMBIEN Take 10 mg by mouth at bedtime.            Discharge Care Instructions        Start     Ordered   12/02/16 0000  oxyCODONE (OXY IR/ROXICODONE) 5 MG immediate release tablet  Every 4 hours PRN    Question:  Supervising Provider  Answer:  Manus Rudd   12/02/16 7902       Follow-up Information    Gweneth Dimitri, MD. Schedule an appointment as soon as possible for a visit in 5 day(s).   Specialty:  Family Medicine Why:  For recheck of symptoms Contact information: [address] [place] [address] [phone]        [facility], [place]. Call today.   Specialty:  General Surgery Why:  For recheck of symptoms and ongoing management of your gallstones Contact information: [address] [place] [address] [phone]          Signed: Hosie Spangle, [facility] 12/02/2016,  10:15 AM Pager: [phone] Consults: [phone] Mon-Fri 7:00 am-4:30 pm Sat-Sun 7:00 am-11:30 am

## 2016-12-04 DIAGNOSIS — K810 Acute cholecystitis: Secondary | ICD-10-CM | POA: Diagnosis not present

## 2017-01-03 DIAGNOSIS — H5203 Hypermetropia, bilateral: Secondary | ICD-10-CM | POA: Diagnosis not present

## 2017-01-03 DIAGNOSIS — H524 Presbyopia: Secondary | ICD-10-CM | POA: Diagnosis not present

## 2017-01-03 DIAGNOSIS — H52223 Regular astigmatism, bilateral: Secondary | ICD-10-CM | POA: Diagnosis not present

## 2017-05-13 DIAGNOSIS — M5416 Radiculopathy, lumbar region: Secondary | ICD-10-CM | POA: Diagnosis not present

## 2017-05-14 DIAGNOSIS — M5126 Other intervertebral disc displacement, lumbar region: Secondary | ICD-10-CM | POA: Diagnosis not present

## 2017-05-14 DIAGNOSIS — M5416 Radiculopathy, lumbar region: Secondary | ICD-10-CM | POA: Diagnosis not present

## 2017-05-20 DIAGNOSIS — Z6828 Body mass index (BMI) 28.0-28.9, adult: Secondary | ICD-10-CM | POA: Diagnosis not present

## 2017-05-20 DIAGNOSIS — M5416 Radiculopathy, lumbar region: Secondary | ICD-10-CM | POA: Diagnosis not present

## 2017-05-24 ENCOUNTER — Other Ambulatory Visit: Payer: Self-pay | Admitting: Student

## 2017-05-24 DIAGNOSIS — M5416 Radiculopathy, lumbar region: Secondary | ICD-10-CM

## 2017-05-29 ENCOUNTER — Ambulatory Visit
Admission: RE | Admit: 2017-05-29 | Discharge: 2017-05-29 | Disposition: A | Discharge status: Home / Self Care | Payer: BLUE CROSS/BLUE SHIELD | Source: Ambulatory Visit | Attending: Student | Admitting: Student

## 2017-05-29 DIAGNOSIS — M5416 Radiculopathy, lumbar region: Secondary | ICD-10-CM

## 2017-05-29 DIAGNOSIS — M545 Low back pain: Secondary | ICD-10-CM | POA: Diagnosis not present

## 2017-05-30 DIAGNOSIS — F410 Panic disorder [episodic paroxysmal anxiety] without agoraphobia: Secondary | ICD-10-CM | POA: Diagnosis not present

## 2017-05-30 DIAGNOSIS — G4709 Other insomnia: Secondary | ICD-10-CM | POA: Diagnosis not present

## 2017-06-03 DIAGNOSIS — R030 Elevated blood-pressure reading, without diagnosis of hypertension: Secondary | ICD-10-CM | POA: Diagnosis not present

## 2017-06-03 DIAGNOSIS — Z6828 Body mass index (BMI) 28.0-28.9, adult: Secondary | ICD-10-CM | POA: Diagnosis not present

## 2017-06-03 DIAGNOSIS — M5416 Radiculopathy, lumbar region: Secondary | ICD-10-CM | POA: Diagnosis not present

## 2017-06-05 ENCOUNTER — Other Ambulatory Visit: Payer: Self-pay | Admitting: Student

## 2017-06-05 DIAGNOSIS — M5416 Radiculopathy, lumbar region: Secondary | ICD-10-CM

## 2017-06-10 ENCOUNTER — Ambulatory Visit
Admission: RE | Admit: 2017-06-10 | Discharge: 2017-06-10 | Disposition: A | Discharge status: Home / Self Care | Payer: BLUE CROSS/BLUE SHIELD | Source: Ambulatory Visit | Attending: Student | Admitting: Student

## 2017-06-10 DIAGNOSIS — M5116 Intervertebral disc disorders with radiculopathy, lumbar region: Secondary | ICD-10-CM | POA: Diagnosis not present

## 2017-06-10 DIAGNOSIS — M5416 Radiculopathy, lumbar region: Secondary | ICD-10-CM

## 2017-06-10 MED ORDER — IOPAMIDOL (ISOVUE-M 200) INJECTION 41%
1.0000 mL | Freq: Once | INTRAMUSCULAR | Status: AC
  Administered 2017-06-10: 1 mL via EPIDURAL

## 2017-06-10 MED ORDER — METHYLPREDNISOLONE ACETATE 40 MG/ML INJ SUSP (RADIOLOG
120.0000 mg | Freq: Once | INTRAMUSCULAR | Status: AC
  Administered 2017-06-10: 120 mg via EPIDURAL

## 2017-06-10 NOTE — Discharge Instructions (Signed)

## 2017-08-27 DIAGNOSIS — E781 Pure hyperglyceridemia: Secondary | ICD-10-CM | POA: Diagnosis not present

## 2017-08-27 DIAGNOSIS — E559 Vitamin D deficiency, unspecified: Secondary | ICD-10-CM | POA: Diagnosis not present

## 2017-08-27 DIAGNOSIS — E782 Mixed hyperlipidemia: Secondary | ICD-10-CM | POA: Diagnosis not present

## 2017-08-27 DIAGNOSIS — Z0000 Encounter for general adult medical examination without abnormal findings: Secondary | ICD-10-CM | POA: Diagnosis not present

## 2017-12-04 DIAGNOSIS — E559 Vitamin D deficiency, unspecified: Secondary | ICD-10-CM | POA: Diagnosis not present

## 2017-12-05 DIAGNOSIS — F900 Attention-deficit hyperactivity disorder, predominantly inattentive type: Secondary | ICD-10-CM | POA: Diagnosis not present

## 2017-12-05 DIAGNOSIS — F410 Panic disorder [episodic paroxysmal anxiety] without agoraphobia: Secondary | ICD-10-CM | POA: Diagnosis not present

## 2018-04-03 DIAGNOSIS — M5416 Radiculopathy, lumbar region: Secondary | ICD-10-CM | POA: Diagnosis not present

## 2018-04-03 DIAGNOSIS — N951 Menopausal and female climacteric states: Secondary | ICD-10-CM | POA: Diagnosis not present

## 2018-04-23 DIAGNOSIS — W109XXA Fall (on) (from) unspecified stairs and steps, initial encounter: Secondary | ICD-10-CM | POA: Diagnosis not present

## 2018-04-23 DIAGNOSIS — M549 Dorsalgia, unspecified: Secondary | ICD-10-CM | POA: Diagnosis not present

## 2018-04-23 DIAGNOSIS — S300XXA Contusion of lower back and pelvis, initial encounter: Secondary | ICD-10-CM | POA: Diagnosis not present

## 2018-04-23 DIAGNOSIS — S3982XA Other specified injuries of lower back, initial encounter: Secondary | ICD-10-CM | POA: Diagnosis not present

## 2018-04-30 DIAGNOSIS — L309 Dermatitis, unspecified: Secondary | ICD-10-CM | POA: Diagnosis not present

## 2018-04-30 DIAGNOSIS — L821 Other seborrheic keratosis: Secondary | ICD-10-CM | POA: Diagnosis not present

## 2018-04-30 DIAGNOSIS — L438 Other lichen planus: Secondary | ICD-10-CM | POA: Diagnosis not present

## 2018-04-30 DIAGNOSIS — M4726 Other spondylosis with radiculopathy, lumbar region: Secondary | ICD-10-CM | POA: Diagnosis not present

## 2018-05-02 DIAGNOSIS — Z981 Arthrodesis status: Secondary | ICD-10-CM | POA: Diagnosis not present

## 2018-05-02 DIAGNOSIS — M4726 Other spondylosis with radiculopathy, lumbar region: Secondary | ICD-10-CM | POA: Diagnosis not present

## 2018-05-02 DIAGNOSIS — M5116 Intervertebral disc disorders with radiculopathy, lumbar region: Secondary | ICD-10-CM | POA: Diagnosis not present

## 2018-05-07 DIAGNOSIS — M5416 Radiculopathy, lumbar region: Secondary | ICD-10-CM | POA: Diagnosis not present

## 2018-05-07 DIAGNOSIS — M4726 Other spondylosis with radiculopathy, lumbar region: Secondary | ICD-10-CM | POA: Diagnosis not present

## 2018-05-07 DIAGNOSIS — Z719 Counseling, unspecified: Secondary | ICD-10-CM | POA: Diagnosis not present

## 2018-05-14 DIAGNOSIS — M4726 Other spondylosis with radiculopathy, lumbar region: Secondary | ICD-10-CM | POA: Diagnosis not present

## 2018-05-22 DIAGNOSIS — M4726 Other spondylosis with radiculopathy, lumbar region: Secondary | ICD-10-CM | POA: Diagnosis not present

## 2018-05-22 DIAGNOSIS — M545 Low back pain: Secondary | ICD-10-CM | POA: Diagnosis not present

## 2018-05-23 DIAGNOSIS — M47816 Spondylosis without myelopathy or radiculopathy, lumbar region: Secondary | ICD-10-CM | POA: Diagnosis not present

## 2018-05-23 DIAGNOSIS — M48061 Spinal stenosis, lumbar region without neurogenic claudication: Secondary | ICD-10-CM | POA: Diagnosis not present

## 2018-05-23 DIAGNOSIS — M5442 Lumbago with sciatica, left side: Secondary | ICD-10-CM | POA: Diagnosis not present

## 2018-05-29 DIAGNOSIS — F410 Panic disorder [episodic paroxysmal anxiety] without agoraphobia: Secondary | ICD-10-CM | POA: Diagnosis not present

## 2018-06-06 DIAGNOSIS — L438 Other lichen planus: Secondary | ICD-10-CM | POA: Diagnosis not present

## 2018-08-06 DIAGNOSIS — M47816 Spondylosis without myelopathy or radiculopathy, lumbar region: Secondary | ICD-10-CM | POA: Diagnosis not present

## 2018-08-13 DIAGNOSIS — L0390 Cellulitis, unspecified: Secondary | ICD-10-CM | POA: Diagnosis not present

## 2018-08-21 DIAGNOSIS — M47816 Spondylosis without myelopathy or radiculopathy, lumbar region: Secondary | ICD-10-CM | POA: Diagnosis not present

## 2018-08-27 DIAGNOSIS — M5441 Lumbago with sciatica, right side: Secondary | ICD-10-CM | POA: Diagnosis not present

## 2018-08-27 DIAGNOSIS — M5442 Lumbago with sciatica, left side: Secondary | ICD-10-CM | POA: Diagnosis not present

## 2018-09-11 DIAGNOSIS — M5442 Lumbago with sciatica, left side: Secondary | ICD-10-CM | POA: Diagnosis not present

## 2018-09-11 DIAGNOSIS — M5441 Lumbago with sciatica, right side: Secondary | ICD-10-CM | POA: Diagnosis not present

## 2018-09-24 DIAGNOSIS — M5441 Lumbago with sciatica, right side: Secondary | ICD-10-CM | POA: Diagnosis not present

## 2018-09-24 DIAGNOSIS — M5442 Lumbago with sciatica, left side: Secondary | ICD-10-CM | POA: Diagnosis not present

## 2018-10-16 DIAGNOSIS — M5442 Lumbago with sciatica, left side: Secondary | ICD-10-CM | POA: Diagnosis not present

## 2018-10-16 DIAGNOSIS — M48061 Spinal stenosis, lumbar region without neurogenic claudication: Secondary | ICD-10-CM | POA: Diagnosis not present

## 2018-10-22 DIAGNOSIS — G8929 Other chronic pain: Secondary | ICD-10-CM | POA: Diagnosis not present

## 2018-10-22 DIAGNOSIS — M5416 Radiculopathy, lumbar region: Secondary | ICD-10-CM | POA: Diagnosis not present

## 2018-10-22 DIAGNOSIS — M5442 Lumbago with sciatica, left side: Secondary | ICD-10-CM | POA: Diagnosis not present

## 2018-10-22 DIAGNOSIS — M48061 Spinal stenosis, lumbar region without neurogenic claudication: Secondary | ICD-10-CM | POA: Diagnosis not present

## 2018-10-22 DIAGNOSIS — M545 Low back pain: Secondary | ICD-10-CM | POA: Diagnosis not present

## 2018-11-07 DIAGNOSIS — M5442 Lumbago with sciatica, left side: Secondary | ICD-10-CM | POA: Diagnosis not present

## 2018-11-07 DIAGNOSIS — M48061 Spinal stenosis, lumbar region without neurogenic claudication: Secondary | ICD-10-CM | POA: Diagnosis not present

## 2018-11-19 DIAGNOSIS — F410 Panic disorder [episodic paroxysmal anxiety] without agoraphobia: Secondary | ICD-10-CM | POA: Diagnosis not present

## 2019-01-06 DIAGNOSIS — M961 Postlaminectomy syndrome, not elsewhere classified: Secondary | ICD-10-CM | POA: Diagnosis not present

## 2019-01-08 DIAGNOSIS — N951 Menopausal and female climacteric states: Secondary | ICD-10-CM | POA: Diagnosis not present

## 2019-01-08 DIAGNOSIS — E559 Vitamin D deficiency, unspecified: Secondary | ICD-10-CM | POA: Diagnosis not present

## 2019-01-08 DIAGNOSIS — Z23 Encounter for immunization: Secondary | ICD-10-CM | POA: Diagnosis not present

## 2019-01-08 DIAGNOSIS — Z13220 Encounter for screening for lipoid disorders: Secondary | ICD-10-CM | POA: Diagnosis not present

## 2019-01-08 DIAGNOSIS — Z0000 Encounter for general adult medical examination without abnormal findings: Secondary | ICD-10-CM | POA: Diagnosis not present

## 2019-01-12 DIAGNOSIS — N959 Unspecified menopausal and perimenopausal disorder: Secondary | ICD-10-CM | POA: Diagnosis not present

## 2019-02-09 DIAGNOSIS — M47816 Spondylosis without myelopathy or radiculopathy, lumbar region: Secondary | ICD-10-CM | POA: Diagnosis not present

## 2019-02-09 DIAGNOSIS — M545 Low back pain: Secondary | ICD-10-CM | POA: Diagnosis not present

## 2019-02-09 DIAGNOSIS — Z79899 Other long term (current) drug therapy: Secondary | ICD-10-CM | POA: Diagnosis not present

## 2019-02-09 DIAGNOSIS — F1120 Opioid dependence, uncomplicated: Secondary | ICD-10-CM | POA: Diagnosis not present

## 2019-02-09 DIAGNOSIS — G894 Chronic pain syndrome: Secondary | ICD-10-CM | POA: Diagnosis not present

## 2019-02-19 ENCOUNTER — Other Ambulatory Visit: Payer: Self-pay | Admitting: Student

## 2019-02-19 DIAGNOSIS — Z1231 Encounter for screening mammogram for malignant neoplasm of breast: Secondary | ICD-10-CM
